# Patient Record
Sex: Female | Born: 1935 | Race: Black or African American | Hispanic: No | Smoking: Never smoker
Health system: Southern US, Community
[De-identification: ages and names within clinical notes are randomized; demographics above are authoritative.]

## PROBLEM LIST (undated history)

## (undated) DIAGNOSIS — I059 Rheumatic mitral valve disease, unspecified: Secondary | ICD-10-CM

## (undated) DIAGNOSIS — F32A Depression, unspecified: Secondary | ICD-10-CM

## (undated) DIAGNOSIS — I10 Essential (primary) hypertension: Secondary | ICD-10-CM

## (undated) DIAGNOSIS — R32 Unspecified urinary incontinence: Secondary | ICD-10-CM

## (undated) DIAGNOSIS — N952 Postmenopausal atrophic vaginitis: Secondary | ICD-10-CM

## (undated) DIAGNOSIS — F419 Anxiety disorder, unspecified: Secondary | ICD-10-CM

## (undated) DIAGNOSIS — T1491XA Suicide attempt, initial encounter: Secondary | ICD-10-CM

## (undated) DIAGNOSIS — R351 Nocturia: Secondary | ICD-10-CM

## (undated) DIAGNOSIS — M1990 Unspecified osteoarthritis, unspecified site: Secondary | ICD-10-CM

## (undated) DIAGNOSIS — J45909 Unspecified asthma, uncomplicated: Secondary | ICD-10-CM

## (undated) DIAGNOSIS — D649 Anemia, unspecified: Secondary | ICD-10-CM

## (undated) DIAGNOSIS — F329 Major depressive disorder, single episode, unspecified: Secondary | ICD-10-CM

## (undated) DIAGNOSIS — K219 Gastro-esophageal reflux disease without esophagitis: Secondary | ICD-10-CM

## (undated) HISTORY — DX: Anxiety disorder, unspecified: F41.9

## (undated) HISTORY — DX: Essential (primary) hypertension: I10

## (undated) HISTORY — DX: Rheumatic mitral valve disease, unspecified: I05.9

## (undated) HISTORY — DX: Anemia, unspecified: D64.9

## (undated) HISTORY — DX: Unspecified asthma, uncomplicated: J45.909

## (undated) HISTORY — DX: Nocturia: R35.1

## (undated) HISTORY — DX: Postmenopausal atrophic vaginitis: N95.2

## (undated) HISTORY — DX: Gastro-esophageal reflux disease without esophagitis: K21.9

## (undated) HISTORY — DX: Unspecified urinary incontinence: R32

## (undated) HISTORY — DX: Unspecified osteoarthritis, unspecified site: M19.90

## (undated) HISTORY — DX: Depression, unspecified: F32.A

## (undated) HISTORY — DX: Suicide attempt, initial encounter: T14.91XA

## (undated) HISTORY — DX: Major depressive disorder, single episode, unspecified: F32.9

---

## 1982-07-24 HISTORY — PX: ABDOMINAL HYSTERECTOMY: SUR658

## 2004-07-24 HISTORY — PX: REPLACEMENT TOTAL KNEE: SUR1224

## 2010-12-27 ENCOUNTER — Ambulatory Visit: Payer: Self-pay | Admitting: Internal Medicine

## 2010-12-29 ENCOUNTER — Ambulatory Visit: Payer: Self-pay | Admitting: Internal Medicine

## 2013-12-16 DIAGNOSIS — M1990 Unspecified osteoarthritis, unspecified site: Secondary | ICD-10-CM | POA: Insufficient documentation

## 2013-12-16 DIAGNOSIS — Z8739 Personal history of other diseases of the musculoskeletal system and connective tissue: Secondary | ICD-10-CM | POA: Insufficient documentation

## 2014-01-17 DIAGNOSIS — E7800 Pure hypercholesterolemia, unspecified: Secondary | ICD-10-CM | POA: Insufficient documentation

## 2014-01-17 DIAGNOSIS — I10 Essential (primary) hypertension: Secondary | ICD-10-CM | POA: Insufficient documentation

## 2014-01-17 DIAGNOSIS — K219 Gastro-esophageal reflux disease without esophagitis: Secondary | ICD-10-CM | POA: Insufficient documentation

## 2014-01-17 DIAGNOSIS — I2510 Atherosclerotic heart disease of native coronary artery without angina pectoris: Secondary | ICD-10-CM | POA: Insufficient documentation

## 2014-01-17 DIAGNOSIS — J310 Chronic rhinitis: Secondary | ICD-10-CM | POA: Insufficient documentation

## 2014-01-17 DIAGNOSIS — Z9114 Patient's other noncompliance with medication regimen: Secondary | ICD-10-CM | POA: Insufficient documentation

## 2014-01-17 DIAGNOSIS — D649 Anemia, unspecified: Secondary | ICD-10-CM | POA: Insufficient documentation

## 2014-01-29 ENCOUNTER — Emergency Department: Payer: Self-pay | Admitting: Emergency Medicine

## 2014-04-15 ENCOUNTER — Ambulatory Visit: Payer: Self-pay | Admitting: Ophthalmology

## 2014-12-23 DIAGNOSIS — Z9189 Other specified personal risk factors, not elsewhere classified: Secondary | ICD-10-CM

## 2014-12-23 DIAGNOSIS — Z2839 Other underimmunization status: Secondary | ICD-10-CM | POA: Insufficient documentation

## 2015-01-28 DIAGNOSIS — Z79899 Other long term (current) drug therapy: Secondary | ICD-10-CM | POA: Insufficient documentation

## 2015-02-24 ENCOUNTER — Encounter: Payer: Self-pay | Admitting: *Deleted

## 2015-02-25 ENCOUNTER — Telehealth: Payer: Self-pay | Admitting: Urology

## 2015-02-25 ENCOUNTER — Encounter: Payer: Self-pay | Admitting: Urology

## 2015-02-25 ENCOUNTER — Ambulatory Visit (INDEPENDENT_AMBULATORY_CARE_PROVIDER_SITE_OTHER): Payer: Medicare Other | Admitting: Urology

## 2015-02-25 VITALS — BP 187/75 | HR 73 | Ht 62.0 in | Wt 217.7 lb

## 2015-02-25 DIAGNOSIS — N952 Postmenopausal atrophic vaginitis: Secondary | ICD-10-CM | POA: Diagnosis not present

## 2015-02-25 DIAGNOSIS — R32 Unspecified urinary incontinence: Secondary | ICD-10-CM

## 2015-02-25 LAB — BLADDER SCAN AMB NON-IMAGING: SCAN RESULT: 12

## 2015-02-25 MED ORDER — OXYBUTYNIN CHLORIDE ER 15 MG PO TB24: 15.0000 mg | ORAL_TABLET | Freq: Every day | ORAL | Status: DC

## 2015-02-25 NOTE — Telephone Encounter (Signed)
Would you call in the compounded estrogen cream to [facility]? 

## 2015-02-25 NOTE — Telephone Encounter (Signed)
Medication called into pharmacy. Pharmacy will contact pt when medication is ready.  

## 2015-02-25 NOTE — Progress Notes (Signed)
02/25/2015 5:02 PM   Annette Combs 08-14-1928 [id number]  Referring provider: No referring provider defined for this encounter.  Chief Complaint  Patient presents with  . Urinary Incontinence    One year recheck    HPI: Annette Combs is an 81 year old African American female with urinary incontinence and vaginal atrophy who presents today for her yearly follow up.  Incontinence: She is still experiencing urinary leakage in spite of the use of oxybutynin ER 15 mg daily. She states that she has to lay towels down on the bed and changes her depends 3 times during the night.  She states that she wears depends during the day as well and may change her depends twice daily. She states she experiences less leakage during the day because she can get to the bathroom on time.  Her leakage volume is quite large at times. At night, she frequently wakes up in the urine has seeped through the depends and onto the towels that she is placed in her bed.    Her PVR is 12 mL. So this is mostly an urge incontinence. She may also has some sleep apnea that may be aggravating the night time incontinence.  She denies fevers, chills, nausea, vomiting or gross hematuria. She also denies any suprapubic pain or abdominal pain.  Atrophic Vaginitis: She is currently using the vaginal estrogen cream 3 times nightly. She is not had any vaginal discomfort or urinary tract infections. Her only complaint concerning the cream is the cost.  PMH: Past Medical History  Diagnosis Date  . Acid reflux   . Anxiety   . Asthma   . Depression   . Hypertension   . Anemia   . Arthritis   . Depression   . Suicide attempt   . Hypertension   . Mitral valve disorder   . Atrophic vaginitis   . Nocturia   . Incontinence     Surgical History: Past Surgical History  Procedure Laterality Date  . Replacement total knee  2006  . Abdominal hysterectomy  1984    Home Medications:    Medication List       This  list is accurate as of: 02/25/15  5:02 PM.  Always use your most recent med list.               acetaminophen 500 MG tablet  Commonly known as:  TYLENOL  Take 500 mg by mouth every 6 (six) hours as needed.     amLODipine 10 MG tablet  Commonly known as:  NORVASC  Take 10 mg by mouth daily.     aspirin 81 MG tablet  Take 81 mg by mouth.     atorvastatin 40 MG tablet  Commonly known as:  LIPITOR  Take 40 mg by mouth daily.     estradiol 0.1 MG/GM vaginal cream  Commonly known as:  ESTRACE  Place 1 Applicatorful vaginally at bedtime.     FLUoxetine 20 MG tablet  Commonly known as:  PROZAC  Take 20 mg by mouth daily.     ibuprofen 200 MG tablet  Commonly known as:  ADVIL,MOTRIN  Take 200 mg by mouth.     losartan 100 MG tablet  Commonly known as:  COZAAR  Take 100 mg by mouth daily.     losartan-hydrochlorothiazide 100-25 MG per tablet  Commonly known as:  HYZAAR  Take by mouth.     metoprolol 50 MG tablet  Commonly known as:  LOPRESSOR  Take  50 mg by mouth.     metoprolol succinate 50 MG 24 hr tablet  Commonly known as:  TOPROL-XL  Take 50 mg by mouth daily. Take with or immediately following a meal.     MIRALAX packet  Generic drug:  polyethylene glycol  Take by mouth.     niMODipine 30 MG capsule  Commonly known as:  NIMOTOP  Take 30 mg by mouth every 4 (four) hours.     omeprazole 20 MG capsule  Commonly known as:  PRILOSEC  Take 20 mg by mouth daily.     oxybutynin 15 MG 24 hr tablet  Commonly known as:  DITROPAN XL  Take 1 tablet (15 mg total) by mouth at bedtime.     oxyCODONE-acetaminophen 5-325 MG per tablet  Commonly known as:  PERCOCET/ROXICET  Take 1 tablet by mouth every 4 (four) hours as needed for severe pain.     potassium chloride 10 MEQ tablet  Commonly known as:  K-DUR,KLOR-CON  Take 10 mEq by mouth 2 (two) times daily.     predniSONE 5 MG tablet  Commonly known as:  DELTASONE  Take 5 mg by mouth.     STOOL SOFTENER 100 MG  capsule  Generic drug:  docusate sodium  Take 100 mg by mouth.     valsartan-hydrochlorothiazide 320-25 MG per tablet  Commonly known as:  DIOVAN-HCT  Take 1 tablet by mouth daily.     ZOFRAN ODT 4 MG disintegrating tablet  Generic drug:  ondansetron  Take 4 mg by mouth.        Allergies: No Known Allergies  Family History: Family History  Problem Relation Age of Onset  . Kidney disease Neg Hx   . Bladder Cancer Neg Hx     Social History:  reports that she has never smoked. She does not have any smokeless tobacco history on file. She reports that she does not drink alcohol or use illicit drugs.  ROS: UROLOGY Frequent Urination?: No Hard to postpone urination?: No Burning/pain with urination?: No Get up at night to urinate?: No Leakage of urine?: Yes Urine stream starts and stops?: No Trouble starting stream?: No Do you have to strain to urinate?: No Blood in urine?: No Urinary tract infection?: No Sexually transmitted disease?: No Injury to kidneys or bladder?: No Painful intercourse?: No Weak stream?: No Currently pregnant?: No Vaginal bleeding?: No Last menstrual period?: n  Gastrointestinal Nausea?: No Vomiting?: No Indigestion/heartburn?: No Diarrhea?: No Constipation?: No  Constitutional Fever: No Night sweats?: No Weight loss?: No Fatigue?: No  Skin Skin rash/lesions?: No Itching?: No  Eyes Blurred vision?: No Double vision?: No  Ears/Nose/Throat Sore throat?: No Sinus problems?: No  Hematologic/Lymphatic Swollen glands?: No Easy bruising?: No  Cardiovascular Leg swelling?: No Chest pain?: No  Respiratory Cough?: No Shortness of breath?: No  Endocrine Excessive thirst?: No  Musculoskeletal Back pain?: No Joint pain?: No  Neurological Headaches?: No Dizziness?: No  Psychologic Depression?: Yes Anxiety?: No  Physical Exam: BP 187/75 mmHg  Pulse 73  Ht 5\' 2"  (1.575 m)  Wt 217 lb 11.2 oz (98.748 kg)  BMI 39.81  kg/m2  GU: Atrophic  external genitalia.  Normal urethral meatus. No urethral masses and/or tenderness. No bladder fullness or masses. No vaginal lesions or discharge. Normal rectal tone, no masses. Normal anus and perineum.   Laboratory Data: No results found for: WBC, HGB, HCT, MCV, PLT  No results found for: CREATININE  No results found for: PSA  No results found for:  TESTOSTERONE  No results found for: HGBA1C  Urinalysis No results found for: COLORURINE, APPEARANCEUR, LABSPEC, PHURINE, GLUCOSEU, HGBUR, BILIRUBINUR, KETONESUR, PROTEINUR, UROBILINOGEN, NITRITE, LEUKOCYTESUR  Pertinent Imaging: Results for orders placed or performed in visit on 02/25/15  BLADDER SCAN AMB NON-IMAGING  Result Value Ref Range   Scan Result 12     Assessment & Plan:    1. Incontinence:   Patient with most likely urge incontinence. I will have her take the oxybutynin in the evening before bed to see if this will decrease the amount of leakage she suffers at night. I have encouraged her to speak with her family care provider about sleep apnea and see if she is a candidate for a sleep study. I explained to her that individuals with sleep apnea may experience nocturia and once the sleep apnea is addressed the nocturia improves.  - BLADDER SCAN AMB NON-IMAGING  2. Atrophic vaginitis:   Patient will continue vaginal estrogen cream. We will call in the compounded vaginal estrogen cream to medical to see if this is a more economical solution for her. I did give her a sample of estrogen cream today. She will continue to apply Monday nights, Wednesday nights and Friday nights with her finger tip.  - BLADDER SCAN AMB NON-IMAGING   Return in about 1 year (around 02/25/2016) for PVR and exam.  Michiel Cowboy, [facility] [address], [address] [place], [place] [address] [phone]

## 2015-06-09 ENCOUNTER — Telehealth: Payer: Self-pay | Admitting: Urology

## 2015-06-09 DIAGNOSIS — N952 Postmenopausal atrophic vaginitis: Secondary | ICD-10-CM

## 2015-06-09 MED ORDER — ESTRADIOL 0.1 MG/GM VA CREA: 1.0000 | TOPICAL_CREAM | Freq: Every day | VAGINAL | Status: DC

## 2015-06-09 NOTE — Telephone Encounter (Signed)
Rachel from [facility] in [place] called and has a question about Annette Combs's 0.01% Estrace Cream prescribed by Annette Combs.   Please give her a call at [phone]

## 2015-06-11 ENCOUNTER — Other Ambulatory Visit: Payer: Self-pay

## 2015-06-11 DIAGNOSIS — N952 Postmenopausal atrophic vaginitis: Secondary | ICD-10-CM

## 2015-06-11 MED ORDER — ESTRADIOL 0.1 MG/GM VA CREA: 1.0000 | TOPICAL_CREAM | Freq: Every day | VAGINAL | Status: AC

## 2015-08-19 DIAGNOSIS — F331 Major depressive disorder, recurrent, moderate: Secondary | ICD-10-CM | POA: Insufficient documentation

## 2015-08-21 DIAGNOSIS — E538 Deficiency of other specified B group vitamins: Secondary | ICD-10-CM | POA: Insufficient documentation

## 2016-02-25 ENCOUNTER — Ambulatory Visit: Payer: Medicare Other | Admitting: Urology

## 2016-02-27 ENCOUNTER — Other Ambulatory Visit: Payer: Self-pay | Admitting: Urology

## 2016-02-27 DIAGNOSIS — F419 Anxiety disorder, unspecified: Secondary | ICD-10-CM | POA: Insufficient documentation

## 2016-02-28 NOTE — Telephone Encounter (Signed)
Patient will need an office visit prior to more refills.

## 2016-03-11 ENCOUNTER — Other Ambulatory Visit: Payer: Self-pay | Admitting: Urology

## 2016-03-29 ENCOUNTER — Encounter: Payer: Self-pay | Admitting: Urology

## 2016-03-29 ENCOUNTER — Ambulatory Visit (INDEPENDENT_AMBULATORY_CARE_PROVIDER_SITE_OTHER): Payer: Medicare Other | Admitting: Urology

## 2016-03-29 VITALS — BP 167/65 | HR 60 | Ht 62.0 in | Wt 213.5 lb

## 2016-03-29 DIAGNOSIS — N952 Postmenopausal atrophic vaginitis: Secondary | ICD-10-CM

## 2016-03-29 DIAGNOSIS — R32 Unspecified urinary incontinence: Secondary | ICD-10-CM | POA: Diagnosis not present

## 2016-03-29 LAB — BLADDER SCAN AMB NON-IMAGING: SCAN RESULT: 58

## 2016-03-29 MED ORDER — OXYBUTYNIN CHLORIDE ER 15 MG PO TB24: 15.0000 mg | ORAL_TABLET | Freq: Every day | ORAL | 12 refills | Status: DC

## 2016-03-29 NOTE — Progress Notes (Signed)
03/29/2016 11:02 AM   Annette Combs 1929-03-12 [id number]  Referring provider: Mickey Farber, MD [address] [facility] [place], [place] [address]  Chief Complaint  Patient presents with  . Follow-up    Incontinence    HPI: Annette Combs is an 82 year old African American female with urinary incontinence and vaginal atrophy who presents today for her yearly follow up.  Incontinence: Patient states that she found benefit switching the oxybutynin ER 15 mg daily to a nighttime dosage.   She states that she is doing well, but her daughter that presented with her today looked as though she disagreed with her mother.  When I asked the daughter for further clarification on her facial expressions, she stated that her mother was the patient and she had nothing to add.  I am suspicious that her daughter may be experiencing frustration with her mother as she is her primary caregiver.  Her PVR is 58  mL.  She denies fevers, chills, nausea, vomiting or gross hematuria.  She also denies any suprapubic pain or abdominal pain.  Atrophic Vaginitis: She is currently using Vaseline to cover her perineum due to the expense of the vaginal estrogen cream.   Her daughter states that she had a urinary tract infection two months ago.  Her daughter states that she was having an increase of mental confusion.    PMH: Past Medical History:  Diagnosis Date  . Acid reflux   . Anemia   . Anxiety   . Arthritis   . Asthma   . Atrophic vaginitis   . Depression   . Depression   . Hypertension   . Hypertension   . Incontinence   . Mitral valve disorder   . Nocturia   . Suicide attempt [facility])     Surgical History: Past Surgical History:  Procedure Laterality Date  . ABDOMINAL HYSTERECTOMY  1984  . REPLACEMENT TOTAL KNEE  2006    Home Medications:    Medication List       Accurate as of 03/29/16 11:02 AM. Always use your most recent med list.          acetaminophen 500 MG  tablet Commonly known as:  TYLENOL Take 500 mg by mouth every 6 (six) hours as needed.   amLODipine 10 MG tablet Commonly known as:  NORVASC Take 10 mg by mouth daily.   aspirin 81 MG tablet Take 81 mg by mouth.   atorvastatin 40 MG tablet Commonly known as:  LIPITOR Take 40 mg by mouth daily.   estradiol 0.1 MG/GM vaginal cream Commonly known as:  ESTRACE Place 1 Applicatorful vaginally at bedtime.   FLUoxetine 20 MG tablet Commonly known as:  PROZAC Take 20 mg by mouth daily.   ibuprofen 200 MG tablet Commonly known as:  ADVIL,MOTRIN Take 200 mg by mouth.   losartan 100 MG tablet Commonly known as:  COZAAR Take 100 mg by mouth daily.   losartan-hydrochlorothiazide 100-25 MG tablet Commonly known as:  HYZAAR Take by mouth.   metoprolol 50 MG tablet Commonly known as:  LOPRESSOR Take 50 mg by mouth.   metoprolol succinate 50 MG 24 hr tablet Commonly known as:  TOPROL-XL Take 50 mg by mouth daily. Take with or immediately following a meal.   MIRALAX packet Generic drug:  polyethylene glycol Take by mouth.   niMODipine 30 MG capsule Commonly known as:  NIMOTOP Take 30 mg by mouth every 4 (four) hours.   omeprazole 20 MG capsule  Commonly known as:  PRILOSEC Take 20 mg by mouth daily.   oxybutynin 15 MG 24 hr tablet Commonly known as:  DITROPAN XL TAKE 1 TABLET (15 MG TOTAL) BY MOUTH AT BEDTIME.   oxybutynin 15 MG 24 hr tablet Commonly known as:  DITROPAN XL Take 1 tablet (15 mg total) by mouth at bedtime.   oxyCODONE-acetaminophen 5-325 MG tablet Commonly known as:  PERCOCET/ROXICET Take 1 tablet by mouth every 4 (four) hours as needed for severe pain.   potassium chloride 10 MEQ tablet Commonly known as:  K-DUR,KLOR-CON Take 10 mEq by mouth 2 (two) times daily.   predniSONE 5 MG tablet Commonly known as:  DELTASONE Take 5 mg by mouth.   STOOL SOFTENER 100 MG capsule Generic drug:  docusate sodium Take 100 mg by mouth.    valsartan-hydrochlorothiazide 320-25 MG tablet Commonly known as:  DIOVAN-HCT Take 1 tablet by mouth daily.   ZOFRAN ODT 4 MG disintegrating tablet Generic drug:  ondansetron Take 4 mg by mouth.       Allergies: No Known Allergies  Family History: Family History  Problem Relation Age of Onset  . Kidney disease Neg Hx   . Bladder Cancer Neg Hx     Social History:  reports that she has never smoked. She has never used smokeless tobacco. She reports that she does not drink alcohol or use drugs.  ROS: UROLOGY Frequent Urination?: No Hard to postpone urination?: No Burning/pain with urination?: No Get up at night to urinate?: No Leakage of urine?: No Urine stream starts and stops?: No Trouble starting stream?: No Do you have to strain to urinate?: No Blood in urine?: No Urinary tract infection?: No Sexually transmitted disease?: No Injury to kidneys or bladder?: No Painful intercourse?: No Weak stream?: No Currently pregnant?: No Vaginal bleeding?: No Last menstrual period?: n  Gastrointestinal Nausea?: No Vomiting?: No Indigestion/heartburn?: No Diarrhea?: No Constipation?: No  Constitutional Fever: No Night sweats?: No Weight loss?: No Fatigue?: No  Skin Skin rash/lesions?: No Itching?: No  Eyes Blurred vision?: No Double vision?: No  Ears/Nose/Throat Sore throat?: No Sinus problems?: No  Hematologic/Lymphatic Swollen glands?: No Easy bruising?: No  Cardiovascular Leg swelling?: No Chest pain?: No  Respiratory Cough?: No Shortness of breath?: No  Endocrine Excessive thirst?: No  Musculoskeletal Back pain?: No Joint pain?: No  Neurological Headaches?: No Dizziness?: No  Psychologic Depression?: No Anxiety?: No  Physical Exam: BP (!) 167/65 (BP Location: Right Arm, Patient Position: Sitting, Cuff Size: Normal)   Pulse 60   Ht 5\' 2"  (1.575 m)   Wt 213 lb 8 oz (96.8 kg)   BMI 39.05 kg/m   Constitutional: Well nourished.  Alert and oriented, No acute distress. HEENT: [place] AT, moist mucus membranes. Trachea midline, no masses. Cardiovascular: No clubbing, cyanosis, or edema. Respiratory: Normal respiratory effort, no increased work of breathing. Skin: No rashes, bruises or suspicious lesions. Lymph: No cervical or inguinal adenopathy. Neurologic: Grossly intact, no focal deficits, moving all 4 extremities. Psychiatric: Normal mood and affect.   Pertinent Imaging: Results for orders placed or performed in visit on 03/29/16  Bladder Scan (Post Void Residual) in office  Result Value Ref Range   Scan Result 58     Assessment & Plan:    1. Incontinence:     - Patient is satisfied with the oxybutynin and will continue that medication  - Refills not needed at this time  - BLADDER SCAN AMB NON-IMAGING  2. Atrophic vaginitis:     -  gave the patient a sample of Premarin cream  - advised to apply the cream three nights weekly  - encouraged the patient to call the office to see if samples are available  - reminded the patient that the estrogen cream will prevent UTI's  Return in about 1 year (around 03/29/2017) for PVR and exam.  Michiel CowboySHANNON Toshia Larkin, [facility]  [facility] [address], [address] [place], [place] [address] [phone]

## 2016-03-29 NOTE — Patient Instructions (Signed)

## 2017-03-28 NOTE — Progress Notes (Signed)
03/29/2017 11:08 AM   Annette Combs [birth year] [id number]  Referring provider: Mickey Farber, MD [address] [facility] [place], [place] [address]  Chief Complaint  Patient presents with  . Urinary Incontinence    1 year follow up   . Vaginitis    HPI: Annette Combs is an 83 year old African American female with urinary incontinence and vaginal atrophy who presents today for her yearly follow up.  Incontinence: Patient is no longer on OAB medications.  The patient is on fall precautions and cannot get up without assistance.  She is currently at [facility] [employer].  She has incontinence 8 or more times daily.  Her PVR is 216.8 mL.  She denies fevers, chills, nausea, vomiting or gross hematuria.  She also denies any suprapubic pain or abdominal pain.  Atrophic Vaginitis: She is currently using Vaseline to cover her perineum due to the expense of the vaginal estrogen cream.   Her daughter states that she had a urinary tract infection in 12/2016.  Her daughter states that she was having an increase of mental confusion.    PMH: Past Medical History:  Diagnosis Date  . Acid reflux   . Anemia   . Anxiety   . Arthritis   . Asthma   . Atrophic vaginitis   . Depression   . Depression   . Hypertension   . Hypertension   . Incontinence   . Mitral valve disorder   . Nocturia   . Suicide attempt [facility])     Surgical History: Past Surgical History:  Procedure Laterality Date  . ABDOMINAL HYSTERECTOMY  1984  . REPLACEMENT TOTAL KNEE  2006    Home Medications:  Allergies as of 03/29/2017   No Known Allergies     Medication List       Accurate as of 03/29/17 11:08 AM. Always use your most recent med list.          acetaminophen 500 MG tablet Commonly known as:  TYLENOL Take 500 mg by mouth every 6 (six) hours as needed.   amLODipine 10 MG tablet Commonly known as:  NORVASC Take 10 mg by mouth daily.   aspirin 81 MG tablet Take 81 mg by mouth.     atorvastatin 40 MG tablet Commonly known as:  LIPITOR Take 40 mg by mouth daily.   cephALEXin 500 MG capsule Commonly known as:  KEFLEX Take 500 mg by mouth.   diclofenac sodium 1 % Gel Commonly known as:  VOLTAREN Apply topically.   estradiol 0.1 MG/GM vaginal cream Commonly known as:  ESTRACE Place 1 Applicatorful vaginally at bedtime.   FLUoxetine 40 MG capsule Commonly known as:  PROZAC Take 40 mg by mouth.   ibuprofen 200 MG tablet Commonly known as:  ADVIL,MOTRIN Take 200 mg by mouth.   losartan 100 MG tablet Commonly known as:  COZAAR Take 100 mg by mouth daily.   losartan-hydrochlorothiazide 100-25 MG tablet Commonly known as:  HYZAAR Take by mouth.   metoprolol succinate 50 MG 24 hr tablet Commonly known as:  TOPROL-XL Take 50 mg by mouth daily. Take with or immediately following a meal.   metoprolol tartrate 50 MG tablet Commonly known as:  LOPRESSOR Take 50 mg by mouth.   MIRALAX packet Generic drug:  polyethylene glycol Take by mouth.   niMODipine 30 MG capsule Commonly known as:  NIMOTOP Take 30 mg by mouth every 4 (four) hours.   omeprazole 20 MG capsule Commonly known as:  PRILOSEC  Take 20 mg by mouth daily.   oxybutynin 15 MG 24 hr tablet Commonly known as:  DITROPAN XL TAKE 1 TABLET (15 MG TOTAL) BY MOUTH AT BEDTIME.   oxybutynin 15 MG 24 hr tablet Commonly known as:  DITROPAN XL Take 1 tablet (15 mg total) by mouth at bedtime.   oxyCODONE-acetaminophen 5-325 MG tablet Commonly known as:  PERCOCET/ROXICET Take 1 tablet by mouth every 4 (four) hours as needed for severe pain.   potassium chloride 10 MEQ tablet Commonly known as:  K-DUR,KLOR-CON Take 10 mEq by mouth 2 (two) times daily.   predniSONE 5 MG tablet Commonly known as:  DELTASONE Take 5 mg by mouth.   rivaroxaban 20 MG Tabs tablet Commonly known as:  XARELTO Take 20 mg by mouth.   SM IRON 325 (65 FE) MG tablet Generic drug:  ferrous sulfate Take 325 mg by  mouth.   STOOL SOFTENER 100 MG capsule Generic drug:  docusate sodium Take 100 mg by mouth.   tamsulosin 0.4 MG Caps capsule Commonly known as:  FLOMAX Take 1 capsule (0.4 mg total) by mouth daily.   valsartan-hydrochlorothiazide 320-25 MG tablet Commonly known as:  DIOVAN-HCT Take 1 tablet by mouth daily.   vitamin B-12 1000 MCG tablet Commonly known as:  CYANOCOBALAMIN Take by mouth.   ZOFRAN ODT 4 MG disintegrating tablet Generic drug:  ondansetron Take 4 mg by mouth.            Discharge Care Instructions        Start     Ordered   03/29/17 0000  BLADDER SCAN AMB NON-IMAGING     03/29/17 1000   03/29/17 0000  tamsulosin (FLOMAX) 0.4 MG CAPS capsule  Daily    Question:  Supervising Provider  Answer:  Vanna Scotland   03/29/17 1107      Allergies: No Known Allergies  Family History: Family History  Problem Relation Age of Onset  . Kidney disease Neg Hx   . Bladder Cancer Neg Hx   . Kidney cancer Neg Hx     Social History:  reports that she has never smoked. She has never used smokeless tobacco. She reports that she does not drink alcohol or use drugs.  ROS: UROLOGY Frequent Urination?: No Hard to postpone urination?: No Burning/pain with urination?: No Get up at night to urinate?: No Leakage of urine?: Yes Urine stream starts and stops?: No Trouble starting stream?: No Do you have to strain to urinate?: No Blood in urine?: No Urinary tract infection?: No Sexually transmitted disease?: No Injury to kidneys or bladder?: No Painful intercourse?: No Weak stream?: No Currently pregnant?: No Vaginal bleeding?: No Last menstrual period?: n  Gastrointestinal Nausea?: No Vomiting?: No Indigestion/heartburn?: No Diarrhea?: No Constipation?: No  Constitutional Fever: No Night sweats?: No Weight loss?: No Fatigue?: No  Skin Skin rash/lesions?: No Itching?: No  Eyes Blurred vision?: No Double vision?: No  Ears/Nose/Throat Sore  throat?: No Sinus problems?: No  Hematologic/Lymphatic Swollen glands?: No Easy bruising?: No  Cardiovascular Leg swelling?: No Chest pain?: No  Respiratory Cough?: No Shortness of breath?: No  Endocrine Excessive thirst?: No  Musculoskeletal Back pain?: No Joint pain?: No  Neurological Headaches?: No Dizziness?: No  Psychologic Depression?: No Anxiety?: No  Physical Exam: BP (!) 144/77   Pulse 71   Ht 5\' 2"  (1.575 m)   Wt 216 lb 12.8 oz (98.3 kg)   BMI 39.65 kg/m   Constitutional: Well nourished. Alert and oriented, No acute distress. HEENT: [place]  AT, moist mucus membranes. Trachea midline, no masses. Cardiovascular: No clubbing, cyanosis, or edema. Respiratory: Normal respiratory effort, no increased work of breathing. GI: Abdomen is soft, non tender, non distended, no abdominal masses. Liver and spleen not palpable.  No hernias appreciated.  Stool sample for occult testing is not indicated.   GU: No CVA tenderness.  No bladder fullness or masses.   Skin: No rashes, bruises or suspicious lesions. Lymph: No cervical or inguinal adenopathy. Neurologic: Grossly intact, no focal deficits, moving all 4 extremities. Psychiatric: Normal mood and affect.   Pertinent Imaging: Results for Annette CzechBROWNING, Annette G (MRN [id number]) as of 03/29/2017 11:14  Ref. Range 03/29/2017 11:13  Scan Result Unknown 216.8    Assessment & Plan:    1. Incontinence:     - BLADDER SCAN AMB NON-IMAGING  - incomplete bladder emptying   - overflow incontinence - start a trial of Flomax   2. Atrophic vaginitis:     - patient finds the vaginal estrogen cream cost prohibitive  3. Nocturia   - I explained to the patient that nocturia is often multi-factorial and difficult to treat.  Sleeping disorders, heart conditions, peripheral vascular disease, diabetes, an enlarged prostate for men, an urethral stricture causing bladder outlet obstruction and/or certain medications can contribute to  nocturia.  - I have suggested that the patient avoid caffeine after noon and alcohol in the evening.  He or she may also benefit from fluid restrictions after 6:00 in the evening and voiding just prior to bedtime.  - I have explained that research studies have showed that over 84% of patients with sleep apnea reported frequent nighttime urination.   With sleep apnea, oxygen decreases, carbon dioxide increases, the blood become more acidic, the heart rate drops and blood vessels in the lung constrict.  The body is then alerted that something is very wrong. The sleeper must wake enough to reopen the airway. By this time, the heart is racing and experiences a false signal of fluid overload. The heart excretes a hormone-like protein that tells the body to get rid of sodium and water, resulting in nocturia.  -  I also informed the patient that a recent study noted that decreasing sodium intake to 2.3 grams daily, if they don't have issues with hyponatremia, can also reduce the number of nightly voids  - There is also an increased incidence in sleep apnea with menopause, symptoms include night sweats, daytime sleepiness, depressed mood, and cognitive complaints like poor concentration or problems with short-term memory   - The patient may benefit from a discussion with his or her primary care physician to see if he or she has risk factors for sleep apnea or other sleep disturbances and obtaining a sleep study.  4. Incomplete bladder emptying  - start tamsulosin 0.4 mg daily  - RTC in one month for OAB questionnaire and PVR    Return in about 1 month (around 04/28/2017) for PVR and OAB questionnaire.  Michiel CowboySHANNON Brayam Boeke, PA-C  [facility] [address], [address] [place], [place] [address] [phone]

## 2017-03-29 ENCOUNTER — Ambulatory Visit (INDEPENDENT_AMBULATORY_CARE_PROVIDER_SITE_OTHER): Payer: Medicare Other | Admitting: Urology

## 2017-03-29 ENCOUNTER — Encounter: Payer: Self-pay | Admitting: Urology

## 2017-03-29 VITALS — BP 144/77 | HR 71 | Ht 62.0 in | Wt 216.8 lb

## 2017-03-29 DIAGNOSIS — N3946 Mixed incontinence: Secondary | ICD-10-CM

## 2017-03-29 DIAGNOSIS — N952 Postmenopausal atrophic vaginitis: Secondary | ICD-10-CM

## 2017-03-29 DIAGNOSIS — R339 Retention of urine, unspecified: Secondary | ICD-10-CM

## 2017-03-29 DIAGNOSIS — R351 Nocturia: Secondary | ICD-10-CM | POA: Diagnosis not present

## 2017-03-29 LAB — BLADDER SCAN AMB NON-IMAGING: SCAN RESULT: 216.8

## 2017-03-29 MED ORDER — TAMSULOSIN HCL 0.4 MG PO CAPS: 0.4000 mg | ORAL_CAPSULE | Freq: Every day | ORAL | 3 refills | Status: DC

## 2017-04-23 ENCOUNTER — Telehealth: Payer: Self-pay | Admitting: Urology

## 2017-04-23 NOTE — Progress Notes (Deleted)
04/25/2017 9:30 PM   Annette Combs October 25, 1928 [id number]  Referring provider: Mickey Farber, MD [address] [facility] [place], [place] [address]  No chief complaint on file.   HPI: Annette Combs is an 83 year old African American female with urinary incontinence and vaginal atrophy who presents today for a one month follow up after a trial of tamsulosin 0.4 mg for incomplete bladder emptying.    Incontinence: Patient is no longer on OAB medications.  The patient is on fall precautions and cannot get up without assistance.  She is currently at [facility] [employer].  She has incontinence 8 or more times daily.  Her PVR is 216.8 mL.  She denies fevers, chills, nausea, vomiting or gross hematuria.  She also denies any suprapubic pain or abdominal pain.  Atrophic Vaginitis: She is currently using Vaseline to cover her perineum due to the expense of the vaginal estrogen cream.   Her daughter states that she had a urinary tract infection in 12/2016.  Her daughter states that she was having an increase of mental confusion.    PMH: Past Medical History:  Diagnosis Date  . Acid reflux   . Anemia   . Anxiety   . Arthritis   . Asthma   . Atrophic vaginitis   . Depression   . Depression   . Hypertension   . Hypertension   . Incontinence   . Mitral valve disorder   . Nocturia   . Suicide attempt [facility])     Surgical History: Past Surgical History:  Procedure Laterality Date  . ABDOMINAL HYSTERECTOMY  1984  . REPLACEMENT TOTAL KNEE  2006    Home Medications:  Allergies as of 04/25/2017   No Known Allergies     Medication List       Accurate as of 04/23/17  9:30 PM. Always use your most recent med list.          acetaminophen 500 MG tablet Commonly known as:  TYLENOL Take 500 mg by mouth every 6 (six) hours as needed.   amLODipine 10 MG tablet Commonly known as:  NORVASC Take 10 mg by mouth daily.   aspirin 81 MG tablet Take 81 mg by mouth.     atorvastatin 40 MG tablet Commonly known as:  LIPITOR Take 40 mg by mouth daily.   diclofenac sodium 1 % Gel Commonly known as:  VOLTAREN Apply topically.   estradiol 0.1 MG/GM vaginal cream Commonly known as:  ESTRACE Place 1 Applicatorful vaginally at bedtime.   FLUoxetine 40 MG capsule Commonly known as:  PROZAC Take 40 mg by mouth.   ibuprofen 200 MG tablet Commonly known as:  ADVIL,MOTRIN Take 200 mg by mouth.   losartan 100 MG tablet Commonly known as:  COZAAR Take 100 mg by mouth daily.   losartan-hydrochlorothiazide 100-25 MG tablet Commonly known as:  HYZAAR Take by mouth.   metoprolol succinate 50 MG 24 hr tablet Commonly known as:  TOPROL-XL Take 50 mg by mouth daily. Take with or immediately following a meal.   metoprolol tartrate 50 MG tablet Commonly known as:  LOPRESSOR Take 50 mg by mouth.   MIRALAX packet Generic drug:  polyethylene glycol Take by mouth.   niMODipine 30 MG capsule Commonly known as:  NIMOTOP Take 30 mg by mouth every 4 (four) hours.   omeprazole 20 MG capsule Commonly known as:  PRILOSEC Take 20 mg by mouth daily.   oxybutynin 15 MG 24 hr tablet Commonly known as:  DITROPAN XL TAKE 1 TABLET (15 MG TOTAL) BY MOUTH AT BEDTIME.   oxybutynin 15 MG 24 hr tablet Commonly known as:  DITROPAN XL Take 1 tablet (15 mg total) by mouth at bedtime.   oxyCODONE-acetaminophen 5-325 MG tablet Commonly known as:  PERCOCET/ROXICET Take 1 tablet by mouth every 4 (four) hours as needed for severe pain.   potassium chloride 10 MEQ tablet Commonly known as:  K-DUR,KLOR-CON Take 10 mEq by mouth 2 (two) times daily.   predniSONE 5 MG tablet Commonly known as:  DELTASONE Take 5 mg by mouth.   rivaroxaban 20 MG Tabs tablet Commonly known as:  XARELTO Take 20 mg by mouth.   SM IRON 325 (65 FE) MG tablet Generic drug:  ferrous sulfate Take 325 mg by mouth.   STOOL SOFTENER 100 MG capsule Generic drug:  docusate sodium Take 100 mg  by mouth.   tamsulosin 0.4 MG Caps capsule Commonly known as:  FLOMAX Take 1 capsule (0.4 mg total) by mouth daily.   valsartan-hydrochlorothiazide 320-25 MG tablet Commonly known as:  DIOVAN-HCT Take 1 tablet by mouth daily.   vitamin B-12 1000 MCG tablet Commonly known as:  CYANOCOBALAMIN Take by mouth.   ZOFRAN ODT 4 MG disintegrating tablet Generic drug:  ondansetron Take 4 mg by mouth.       Allergies: No Known Allergies  Family History: Family History  Problem Relation Age of Onset  . Kidney disease Neg Hx   . Bladder Cancer Neg Hx   . Kidney cancer Neg Hx     Social History:  reports that she has never smoked. She has never used smokeless tobacco. She reports that she does not drink alcohol or use drugs.  ROS:                                        Physical Exam: There were no vitals taken for this visit.  Constitutional: Well nourished. Alert and oriented, No acute distress. HEENT: [place] AT, moist mucus membranes. Trachea midline, no masses. Cardiovascular: No clubbing, cyanosis, or edema. Respiratory: Normal respiratory effort, no increased work of breathing. GI: Abdomen is soft, non tender, non distended, no abdominal masses. Liver and spleen not palpable.  No hernias appreciated.  Stool sample for occult testing is not indicated.   GU: No CVA tenderness.  No bladder fullness or masses.   Skin: No rashes, bruises or suspicious lesions. Lymph: No cervical or inguinal adenopathy. Neurologic: Grossly intact, no focal deficits, moving all 4 extremities. Psychiatric: Normal mood and affect.   Pertinent Imaging: ***   Assessment & Plan:    1. Incontinence:     - BLADDER SCAN AMB NON-IMAGING  - incomplete bladder emptying   - overflow incontinence - start a trial of Flomax   2. Atrophic vaginitis:     - patient finds the vaginal estrogen cream cost prohibitive - sample tube of Premarin given on 04/23/2017  3. Nocturia   - I  explained to the patient that nocturia is often multi-factorial and difficult to treat.  Sleeping disorders, heart conditions, peripheral vascular disease, diabetes, an enlarged prostate for men, an urethral stricture causing bladder outlet obstruction and/or certain medications can contribute to nocturia.  - I have suggested that the patient avoid caffeine after noon and alcohol in the evening.  He or she may also benefit from fluid restrictions after 6:00 in the evening and voiding just  prior to bedtime.  - I have explained that research studies have showed that over 84% of patients with sleep apnea reported frequent nighttime urination.   With sleep apnea, oxygen decreases, carbon dioxide increases, the blood become more acidic, the heart rate drops and blood vessels in the lung constrict.  The body is then alerted that something is very wrong. The sleeper must wake enough to reopen the airway. By this time, the heart is racing and experiences a false signal of fluid overload. The heart excretes a hormone-like protein that tells the body to get rid of sodium and water, resulting in nocturia.  -  I also informed the patient that a recent study noted that decreasing sodium intake to 2.3 grams daily, if they don't have issues with hyponatremia, can also reduce the number of nightly voids  - There is also an increased incidence in sleep apnea with menopause, symptoms include night sweats, daytime sleepiness, depressed mood, and cognitive complaints like poor concentration or problems with short-term memory   - The patient may benefit from a discussion with his or her primary care physician to see if he or she has risk factors for sleep apnea or other sleep disturbances and obtaining a sleep study.  4. Incomplete bladder emptying  - start tamsulosin 0.4 mg daily  - RTC in one month for OAB questionnaire and PVR    No Follow-up on file.  Michiel Cowboy, PA-C  [facility] [address], [address] [place], [place] [address] [phone]

## 2017-04-23 NOTE — Telephone Encounter (Signed)
Daughter calls asking for samples for her mother, Estrace vaginal cream. Daughter will be in the area this morning. Please let daughter know if she can pick up samples this morning, call Pandora at [phone]

## 2017-04-25 ENCOUNTER — Ambulatory Visit: Payer: Medicare Other | Admitting: Urology

## 2017-04-26 NOTE — Telephone Encounter (Signed)
Samples were given  °

## 2018-05-30 ENCOUNTER — Other Ambulatory Visit
Admission: RE | Admit: 2018-05-30 | Discharge: 2018-05-30 | Disposition: A | Discharge status: Home / Self Care | Payer: Medicare Other | Source: Ambulatory Visit | Attending: Family Medicine | Admitting: Family Medicine

## 2018-05-30 DIAGNOSIS — I509 Heart failure, unspecified: Secondary | ICD-10-CM | POA: Diagnosis present

## 2018-05-30 DIAGNOSIS — R0602 Shortness of breath: Secondary | ICD-10-CM | POA: Insufficient documentation

## 2018-05-30 LAB — BRAIN NATRIURETIC PEPTIDE: B Natriuretic Peptide: 41 pg/mL (ref 0.0–100.0)

## 2018-12-18 ENCOUNTER — Emergency Department
Admission: EM | Admit: 2018-12-18 | Discharge: 2018-12-18 | Disposition: A | Discharge status: Home / Self Care | Payer: Medicare Other | Attending: Emergency Medicine | Admitting: Emergency Medicine

## 2018-12-18 ENCOUNTER — Other Ambulatory Visit: Payer: Self-pay

## 2018-12-18 ENCOUNTER — Telehealth: Payer: Self-pay | Admitting: *Deleted

## 2018-12-18 ENCOUNTER — Encounter: Payer: Self-pay | Admitting: Emergency Medicine

## 2018-12-18 ENCOUNTER — Emergency Department: Payer: Medicare Other

## 2018-12-18 DIAGNOSIS — J45909 Unspecified asthma, uncomplicated: Secondary | ICD-10-CM | POA: Insufficient documentation

## 2018-12-18 DIAGNOSIS — U071 COVID-19: Secondary | ICD-10-CM

## 2018-12-18 DIAGNOSIS — J9601 Acute respiratory failure with hypoxia: Secondary | ICD-10-CM | POA: Diagnosis not present

## 2018-12-18 DIAGNOSIS — I10 Essential (primary) hypertension: Secondary | ICD-10-CM | POA: Insufficient documentation

## 2018-12-18 DIAGNOSIS — R0602 Shortness of breath: Secondary | ICD-10-CM | POA: Diagnosis present

## 2018-12-18 DIAGNOSIS — R0902 Hypoxemia: Secondary | ICD-10-CM | POA: Diagnosis not present

## 2018-12-18 DIAGNOSIS — R05 Cough: Secondary | ICD-10-CM | POA: Insufficient documentation

## 2018-12-18 LAB — CBC WITH DIFFERENTIAL/PLATELET
Abs Immature Granulocytes: 0.26 10*3/uL — ABNORMAL HIGH (ref 0.00–0.07)
Basophils Absolute: 0 10*3/uL (ref 0.0–0.1)
Basophils Relative: 0 %
Eosinophils Absolute: 0 10*3/uL (ref 0.0–0.5)
Eosinophils Relative: 0 %
HCT: 38.5 % (ref 36.0–46.0)
Hemoglobin: 12.1 g/dL (ref 12.0–15.0)
Immature Granulocytes: 3 %
Lymphocytes Relative: 9 %
Lymphs Abs: 0.8 10*3/uL (ref 0.7–4.0)
MCH: 31.3 pg (ref 26.0–34.0)
MCHC: 31.4 g/dL (ref 30.0–36.0)
MCV: 99.5 fL (ref 80.0–100.0)
Monocytes Absolute: 0.4 10*3/uL (ref 0.1–1.0)
Monocytes Relative: 5 %
Neutro Abs: 7.2 10*3/uL (ref 1.7–7.7)
Neutrophils Relative %: 83 %
Platelets: 223 10*3/uL (ref 150–400)
RBC: 3.87 MIL/uL (ref 3.87–5.11)
RDW: 15.9 % — ABNORMAL HIGH (ref 11.5–15.5)
WBC: 8.6 10*3/uL (ref 4.0–10.5)
nRBC: 0 % (ref 0.0–0.2)

## 2018-12-18 LAB — LACTATE DEHYDROGENASE: LDH: 406 U/L — ABNORMAL HIGH (ref 98–192)

## 2018-12-18 LAB — BLOOD GAS, VENOUS
Acid-Base Excess: 2.4 mmol/L — ABNORMAL HIGH (ref 0.0–2.0)
Bicarbonate: 28.8 mmol/L — ABNORMAL HIGH (ref 20.0–28.0)
O2 Saturation: 88 %
Patient temperature: 37
pCO2, Ven: 51 mmHg (ref 44.0–60.0)
pH, Ven: 7.36 (ref 7.250–7.430)
pO2, Ven: 57 mmHg — ABNORMAL HIGH (ref 32.0–45.0)

## 2018-12-18 LAB — COMPREHENSIVE METABOLIC PANEL
ALT: 30 U/L (ref 0–44)
AST: 50 U/L — ABNORMAL HIGH (ref 15–41)
Albumin: 3.2 g/dL — ABNORMAL LOW (ref 3.5–5.0)
Alkaline Phosphatase: 50 U/L (ref 38–126)
Anion gap: 12 (ref 5–15)
BUN: 37 mg/dL — ABNORMAL HIGH (ref 8–23)
CO2: 28 mmol/L (ref 22–32)
Calcium: 9 mg/dL (ref 8.9–10.3)
Chloride: 98 mmol/L (ref 98–111)
Creatinine, Ser: 0.98 mg/dL (ref 0.44–1.00)
GFR calc Af Amer: 60 mL/min — ABNORMAL LOW (ref >60)
GFR calc non Af Amer: 52 mL/min — ABNORMAL LOW (ref >60)
Glucose, Bld: 109 mg/dL — ABNORMAL HIGH (ref 70–99)
Potassium: 4 mmol/L (ref 3.5–5.1)
Sodium: 138 mmol/L (ref 135–145)
Total Bilirubin: 1 mg/dL (ref 0.3–1.2)
Total Protein: 7 g/dL (ref 6.5–8.1)

## 2018-12-18 LAB — C-REACTIVE PROTEIN: CRP: 16.5 mg/dL — ABNORMAL HIGH (ref <1.0)

## 2018-12-18 LAB — FERRITIN: Ferritin: 434 ng/mL — ABNORMAL HIGH (ref 11–307)

## 2018-12-18 LAB — LACTIC ACID, PLASMA: Lactic Acid, Venous: 1.3 mmol/L (ref 0.5–1.9)

## 2018-12-18 LAB — TRIGLYCERIDES: Triglycerides: 118 mg/dL (ref <150)

## 2018-12-18 LAB — FIBRINOGEN: Fibrinogen: 548 mg/dL — ABNORMAL HIGH (ref 210–475)

## 2018-12-18 LAB — FIBRIN DERIVATIVES D-DIMER (ARMC ONLY): Fibrin derivatives D-dimer (ARMC): 2680.78 ng/mL (FEU) — ABNORMAL HIGH (ref 0.00–499.00)

## 2018-12-18 LAB — PROCALCITONIN: Procalcitonin: <0.1 ng/mL

## 2018-12-18 MED ORDER — SODIUM CHLORIDE 0.9 % IV BOLUS
1000.0000 mL | Freq: Once | INTRAVENOUS | Status: AC
  Administered 2018-12-18: 1000 mL via INTRAVENOUS

## 2018-12-18 NOTE — Telephone Encounter (Signed)
Calling to report positive COVID testing on patient- trying to reach infectious disease- she will call them directly.

## 2018-12-18 NOTE — ED Notes (Signed)
Pt with [facility] ground transport at this time. Pt in NAD while leaving ED.   DNR in paperwork packet, earrings placed with bed pad in pts belongings bag.

## 2018-12-18 NOTE — ED Notes (Signed)
Pt placed on 6L  due to decreased oxygen saturation.

## 2018-12-18 NOTE — ED Triage Notes (Signed)
Pt here with via EMS from [facility], Covid +, c/o increasing shob over the past day, denies pain, alert and oriented to place, self and time. 4L [place] sats 95% currently. Pt in no distress.

## 2018-12-18 NOTE — ED Notes (Signed)
[facility] ground transport updated in pts status change.

## 2018-12-18 NOTE — ED Notes (Signed)
Pt calm, resting, no wheezing at this time. Pt unable to speak in full sentences, however states she is comfortable. Sats 96% on 4L.

## 2018-12-18 NOTE — ED Provider Notes (Addendum)
[facility] Emergency Department Provider Note  ____________________________________________  Time seen: Approximately 11:14 AM  I have reviewed the triage vital signs and the nursing notes.   HISTORY  Chief Complaint Shortness of breath   HPI Annette CzechMary G Combs is a 85 y.o. female with a history of hypertension, depression, GERD, CAD  who is known to have novel coronavirus infection sent [facility] ED [facility] due to worsening shortness of breath and low oxygen saturation.  EMS report room air oxygen saturation of about 78%, increased to 93% on 4 L nasal cannula.  Patient complains of shortness of breath is been gradually worsening for a week.  Nonproductive cough as well.  Denies fever or chest pain.  Symptoms are constant, worse with movement, worse lying down, better sitting upright.     Past Medical History:  Diagnosis Date  . Acid reflux   . Anemia   . Anxiety   . Arthritis   . Asthma   . Atrophic vaginitis   . Depression   . Depression   . Hypertension   . Hypertension   . Incontinence   . Mitral valve disorder   . Nocturia   . Suicide attempt [facility](HCC)      Patient Active Problem List   Diagnosis Date Noted  . Anxiety 02/27/2016  . B12 deficiency 08/21/2015  . Moderate episode of recurrent major depressive disorder (HCC) 08/19/2015  . Urinary incontinence 02/25/2015  . Atrophic vaginitis 02/25/2015  . High risk medication use 01/28/2015  . Immunizations incomplete 12/23/2014  . Anemia 01/17/2014  . CAD (coronary artery disease) 01/17/2014  . GERD (gastroesophageal reflux disease) 01/17/2014  . Hypertension 01/17/2014  . 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 Noncompliance with medication regimen 01/17/2014  . Pure hypercholesterolemia 01/17/2014  . H/O calcium pyrophosphate deposition disease (CPPD) 12/16/2013  . Osteoarthritis 12/16/2013  . Osteoarthrosis 12/16/2013  . Personal history of other diseases of the musculoskeletal system and  connective tissue 12/16/2013     Past Surgical History:  Procedure Laterality Date  . ABDOMINAL HYSTERECTOMY  1984  . REPLACEMENT TOTAL KNEE  2006     Prior to Admission medications   Medication Sig Start Date End Date Taking? Authorizing Provider  acetaminophen (TYLENOL) 500 MG tablet Take 500 mg by mouth every 6 (six) hours as needed.    [provider]  amLODipine (NORVASC) 10 MG tablet Take 10 mg by mouth daily.    [provider]  aspirin 81 MG tablet Take 81 mg by mouth. 05/03/12   [provider]  atorvastatin (LIPITOR) 40 MG tablet Take 40 mg by mouth daily.    [provider]  docusate sodium (STOOL SOFTENER) 100 MG capsule Take 100 mg by mouth. 05/03/12   [provider]  estradiol (ESTRACE) 0.1 MG/GM vaginal cream Place 1 Applicatorful vaginally at bedtime. Patient not taking: Reported on 03/29/2016 06/11/15   Michiel CowboyMcGowan, Shannon A, PA-C  ferrous sulfate (SM IRON) 325 (65 FE) MG tablet Take 325 mg by mouth.    [provider]  FLUoxetine (PROZAC) 40 MG capsule Take 40 mg by mouth. 02/11/17   [provider]  ibuprofen (ADVIL,MOTRIN) 200 MG tablet Take 200 mg by mouth.    [provider]  losartan (COZAAR) 100 MG tablet Take 100 mg by mouth daily.    [provider]  losartan-hydrochlorothiazide (HYZAAR) 100-25 MG per tablet Take by mouth. 05/03/12   [provider]  metoprolol (LOPRESSOR) 50 MG tablet Take 50 mg by  mouth. 05/03/12   [provider]  metoprolol succinate (TOPROL-XL) 50 MG 24 hr tablet Take 50 mg by mouth daily. Take with or immediately following a meal.    [provider]  niMODipine (NIMOTOP) 30 MG capsule Take 30 mg by mouth every 4 (four) hours.    [provider]  omeprazole (PRILOSEC) 20 MG capsule Take 20 mg by mouth daily.    [provider]  ondansetron (ZOFRAN ODT) 4 MG disintegrating tablet Take 4 mg by mouth. 05/03/12   [provider]  oxybutynin (DITROPAN XL) 15 MG 24 hr tablet TAKE 1 TABLET (15 MG TOTAL) BY MOUTH AT BEDTIME. Patient not taking: Reported on 03/29/2017 03/11/16   Michiel Cowboy A, PA-C  oxybutynin (DITROPAN XL) 15 MG 24 hr tablet Take 1 tablet (15 mg total) by mouth at bedtime. Patient not taking: Reported on 03/29/2017 03/29/16   Michiel Cowboy A, PA-C  oxyCODONE-acetaminophen (PERCOCET/ROXICET) 5-325 MG per tablet Take 1 tablet by mouth every 4 (four) hours as needed for severe pain.    [provider]  polyethylene glycol [facility]) packet Take by mouth. 05/03/12   [provider]  potassium chloride (K-DUR,KLOR-CON) 10 MEQ tablet Take 10 mEq by mouth 2 (two) times daily.    [provider]  predniSONE (DELTASONE) 5 MG tablet Take 5 mg by mouth. 01/18/15   [provider]  rivaroxaban (XARELTO) 20 MG TABS tablet Take 20 mg by mouth.    [provider]  tamsulosin (FLOMAX) 0.4 MG CAPS capsule Take 1 capsule (0.4 mg total) by mouth daily. 03/29/17   Michiel Cowboy A, PA-C  valsartan-hydrochlorothiazide (DIOVAN-HCT) 320-25 MG per tablet Take 1 tablet by mouth daily.    [provider]  vitamin B-12 (CYANOCOBALAMIN) 1000 MCG tablet Take by mouth.    [provider]     Allergies Patient has no known allergies.   Family History  Problem Relation Age of Onset  . Kidney disease Neg Hx   . Bladder Cancer Neg Hx   . Kidney cancer Neg Hx     Social History Social History   Tobacco Use  . Smoking status: Never Smoker  . Smokeless tobacco: Never Used  Substance Use Topics  . Alcohol use: No    Alcohol/week: 0.0 standard drinks  . Drug use: No    Review of Systems  Constitutional:   No fever or chills.  ENT:   No sore throat. No rhinorrhea. Cardiovascular:   No chest pain or syncope. Respiratory:   Positive shortness of breath and cough. Gastrointestinal:   Negative for abdominal pain, vomiting and diarrhea.   Musculoskeletal:   Negative for focal pain or swelling All other systems reviewed and are negative except as documented above in ROS and HPI.  ____________________________________________   PHYSICAL EXAM:  VITAL SIGNS: ED Triage Vitals  Enc Vitals Group     BP      Pulse      Resp      Temp      Temp src      SpO2      Weight      Height      Head Circumference      Peak Flow      Pain Score      Pain Loc      Pain Edu?      Excl. in GC?     Vital signs reviewed, nursing assessments reviewed.   Constitutional:   Alert and oriented.  Ill-appearing. Eyes:   Conjunctivae are normal. EOMI. PERRL. ENT      Head:   Normocephalic and atraumatic.      Nose:   No congestion/rhinnorhea.       Mouth/Throat:   MMM, no pharyngeal erythema. No peritonsillar mass.       Neck:   No meningismus. Full ROM. Hematological/Lymphatic/Immunilogical:   No cervical lymphadenopathy. Cardiovascular:   RRR. Symmetric bilateral radial and DP pulses.  No murmurs. Cap refill less than 2 seconds. Respiratory: Tachypnea with normal work of breathing.  Diffuse expiratory wheezing.  No focal crackles.  Symmetric breath sounds. Gastrointestinal:   Soft and nontender. Non distended. There is no CVA tenderness.  No rebound, rigidity, or guarding.  Musculoskeletal:   Normal range of motion in all extremities. No joint effusions.  No lower extremity tenderness.  No edema. Neurologic:   Normal speech and language.  Motor grossly intact. No acute focal neurologic deficits are appreciated.  Skin:    Skin is warm, dry and intact. No rash noted.  No petechiae, purpura, or bullae.  ____________________________________________    LABS (pertinent positives/negatives) (all labs ordered are listed, but only abnormal results are displayed) Labs Reviewed  CULTURE, BLOOD (ROUTINE X 2)  CULTURE, BLOOD (ROUTINE X 2)  LACTIC ACID, PLASMA  LACTIC ACID, PLASMA  CBC WITH DIFFERENTIAL/PLATELET  COMPREHENSIVE  METABOLIC PANEL  FIBRIN DERIVATIVES D-DIMER ([facility] ONLY)  PROCALCITONIN  LACTATE DEHYDROGENASE  FERRITIN  TRIGLYCERIDES  FIBRINOGEN  C-REACTIVE PROTEIN   ____________________________________________   EKG    ____________________________________________    RADIOLOGY  No results found.  ____________________________________________   PROCEDURES .Critical Care Performed by: Sharman Cheek, MD Authorized by: Sharman Cheek, MD   Critical care provider statement:    Critical care time (minutes):  35   Critical care time was exclusive of:  Separately billable procedures and treating other patients   Critical care was necessary to treat or prevent imminent or life-threatening deterioration of the following conditions:  Respiratory failure   Critical care was time spent personally by me on the following activities:  Development of treatment plan with patient or surrogate, discussions with consultants, evaluation of patient's response to treatment, examination of patient, obtaining history from patient or surrogate, ordering and performing treatments and interventions, ordering and review of laboratory studies, ordering and review of radiographic studies, pulse oximetry, re-evaluation of patient's condition and review of old charts    ____________________________________________    CLINICAL IMPRESSION / ASSESSMENT AND PLAN / ED COURSE  Medications ordered in the ED: Medications - No data to display  Pertinent labs & imaging results that were available during my care of the patient were reviewed by me and considered in my medical decision making (see chart for details).  Annette Combs was evaluated in Emergency Department on 12/18/2018 for the symptoms described in the history of present illness. She was evaluated in the context of the global COVID-19 pandemic, which necessitated consideration that the patient might be at risk for infection with the SARS-CoV-2 virus that  causes COVID-19. Institutional protocols and algorithms that pertain to the evaluation of patients at risk for COVID-19 are in a state of rapid change based on information released by regulatory bodies including the CDC and federal and state organizations. These policies and algorithms were followed during the patient's care in the ED.   Patient presents with respiratory distress in the setting of COVID-19 infection.  She is hypoxic, but oxygenation stabilized to 93 or 94% on 4 L nasal cannula.  Will get a chest x-ray to evaluate for possible pulmonary edema, pleural effusion, consolidative pneumonia, but most likely this is viral pneumonitis.  Patient agrees to hospitalization and to transfer to [facility] for health system cohort doing of COVID patients in the midst of this pandemic and for specialized COVID centered care.  Doubt sepsis, not in shock.  DNR paperwork on chart.  Clinical Course as of Dec 17 1398  Wed Dec 18, 2018  1111 Patient From [facility], COVID positive patient from [facility], known COVID positive, sent to the ED due to worsening shortness of breath and hypoxia.  Wheezing, tachypnea.  Oxygenation 94% on 4 L nasal cannula.  Patient confirms DNR status.  Agrees to transfer to [facility].  Will check labs, chest x-ray, plan for admission.   [PS]  1157 D/w daughter Mariana Single, who indicates preference for [facility] as transfer hospital. I will contact [facility] to see if they have ability to accept the patient. Confirmed DNR status with daughter as well.   [PS]  1247 Elevated due to COVID. Doubt PE  Fibrin derivatives D-dimer [facility])(!): [phone] [PS]  1315 I discussed the case with the [facility] medical admitting officer, who needed to confer with their COVID team to ensure they can provide appropriate care for the patient.  The [facility] did later call to inform us that the patient is being accepted for transfer to [facility]. Awaiting bed assignment.   [PS]  1349 Oxygenation improved to  97%.  With improved oxygenation, patient is breathing more comfortably and wheezing has diminished just based on change in position and effort.  She has not received steroids or bronchodilators in the ED.   [PS]    Clinical Course User Index [PS] Sharman Cheek, MD     ----------------------------------------- 2:00 PM on 12/18/2018 -----------------------------------------  Accepted by Lucienne Minks, attending Maggie Schwalbe..  Blood pressure trended downward although map is still normal.  I will give a 1 L IV fluid bolus.  Oxygen levels 91% on the 4 L.  We will try repositioning but also increased to 6 L nasal cannula.  Confirmed on nursing home paperwork patient was diagnosed COVID + Dec 10, 2018.  Procalcitonin negative.  Antibiotics not indicated without evidence of bacterial pneumonia.  ----------------------------------------- 2:31 PM on 12/18/2018 -----------------------------------------  Oxygenation worse at 90%.  Patient seems more drowsy.  Will check a VBG and put her on high flow nasal cannula.  ____________________________________________   FINAL CLINICAL IMPRESSION(S) / ED DIAGNOSES    Final diagnoses:  COVID-19 virus infection  Acute respiratory failure with hypoxia [facility])     ED Discharge Orders    None      Portions of this note were generated with dragon dictation software. Dictation errors may occur despite best attempts at proofreading.   Sharman Cheek, MD 12/18/18 1401    Sharman Cheek, MD 12/18/18 1402    Sharman Cheek, MD 12/18/18 [phone]

## 2018-12-18 NOTE — ED Notes (Signed)
Bed locked and low. Pt denies complaints, rise and fall of chest noted. Monitor attached, this RN at bedside.

## 2018-12-18 NOTE — ED Notes (Signed)
Upon reassessment pt is sleeping and more drowsy than previously. Pt is able to answer questions but falls back to sleep immediately. Pt is not able to roll or assist with self care at this time and reports, " I am just too sore." MD made aware of increased drowsiness.

## 2018-12-19 MED ORDER — HYDRALAZINE HCL 10 MG PO TABS
10.00 | ORAL_TABLET | ORAL | Status: DC
Start: 2018-12-19 — End: 2018-12-19

## 2018-12-19 MED ORDER — PREDNISONE 20 MG PO TABS
20.00 | ORAL_TABLET | ORAL | Status: DC
Start: 2018-12-19 — End: 2018-12-19

## 2018-12-19 MED ORDER — ENOXAPARIN SODIUM 40 MG/0.4ML ~~LOC~~ SOLN
40.00 | SUBCUTANEOUS | Status: DC
Start: 2018-12-19 — End: 2018-12-19

## 2018-12-19 MED ORDER — PANTOPRAZOLE SODIUM 20 MG PO TBEC
20.00 | DELAYED_RELEASE_TABLET | ORAL | Status: DC
Start: 2018-12-19 — End: 2018-12-19

## 2018-12-19 MED ORDER — ACETAMINOPHEN 500 MG PO TABS
1000.00 | ORAL_TABLET | ORAL | Status: DC
Start: ? — End: 2018-12-19

## 2018-12-19 MED ORDER — BUSPIRONE HCL 10 MG PO TABS
10.00 | ORAL_TABLET | ORAL | Status: DC
Start: 2018-12-19 — End: 2018-12-19

## 2018-12-19 MED ORDER — POLYETHYLENE GLYCOL 3350 17 G PO PACK
17.00 | PACK | ORAL | Status: DC
Start: 2018-12-19 — End: 2018-12-19

## 2018-12-19 MED ORDER — ALBUTEROL SULFATE HFA 108 (90 BASE) MCG/ACT IN AERS
2.00 | INHALATION_SPRAY | RESPIRATORY_TRACT | Status: DC
Start: 2018-12-19 — End: 2018-12-19

## 2018-12-19 MED ORDER — ASPIRIN 81 MG PO CHEW
81.00 | CHEWABLE_TABLET | ORAL | Status: DC
Start: 2018-12-19 — End: 2018-12-19

## 2018-12-19 MED ORDER — ATORVASTATIN CALCIUM 40 MG PO TABS
40.00 | ORAL_TABLET | ORAL | Status: DC
Start: 2018-12-19 — End: 2018-12-19

## 2018-12-19 MED ORDER — VITAMIN B-12 1000 MCG PO TABS
1000.00 | ORAL_TABLET | ORAL | Status: DC
Start: 2018-12-19 — End: 2018-12-19

## 2018-12-19 MED ORDER — ESCITALOPRAM OXALATE 20 MG PO TABS
20.00 | ORAL_TABLET | ORAL | Status: DC
Start: 2018-12-19 — End: 2018-12-19

## 2018-12-19 MED ORDER — GENERIC EXTERNAL MEDICATION
1.00 | Status: DC
Start: 2018-12-19 — End: 2018-12-19

## 2018-12-23 LAB — CULTURE, BLOOD (ROUTINE X 2)
Culture: NO GROWTH
Culture: NO GROWTH
Special Requests: ADEQUATE
Special Requests: ADEQUATE

## 2020-06-13 ENCOUNTER — Inpatient Hospital Stay: Payer: Medicare Other

## 2020-06-13 ENCOUNTER — Inpatient Hospital Stay
Admission: EM | Admit: 2020-06-13 | Discharge: 2020-06-16 | DRG: 124 | Disposition: A | Discharge status: Skilled Nursing Facility | Payer: Medicare Other | Attending: Internal Medicine | Admitting: Internal Medicine

## 2020-06-13 ENCOUNTER — Emergency Department: Payer: Medicare Other

## 2020-06-13 ENCOUNTER — Other Ambulatory Visit: Payer: Self-pay

## 2020-06-13 DIAGNOSIS — M79601 Pain in right arm: Secondary | ICD-10-CM | POA: Diagnosis present

## 2020-06-13 DIAGNOSIS — M79652 Pain in left thigh: Secondary | ICD-10-CM | POA: Diagnosis present

## 2020-06-13 DIAGNOSIS — Z6841 Body Mass Index (BMI) 40.0 and over, adult: Secondary | ICD-10-CM

## 2020-06-13 DIAGNOSIS — Z66 Do not resuscitate: Secondary | ICD-10-CM | POA: Diagnosis present

## 2020-06-13 DIAGNOSIS — E876 Hypokalemia: Secondary | ICD-10-CM | POA: Diagnosis not present

## 2020-06-13 DIAGNOSIS — H0520 Unspecified exophthalmos: Secondary | ICD-10-CM | POA: Diagnosis present

## 2020-06-13 DIAGNOSIS — H1132 Conjunctival hemorrhage, left eye: Secondary | ICD-10-CM | POA: Diagnosis present

## 2020-06-13 DIAGNOSIS — F32A Depression, unspecified: Secondary | ICD-10-CM | POA: Diagnosis present

## 2020-06-13 DIAGNOSIS — D6959 Other secondary thrombocytopenia: Secondary | ICD-10-CM | POA: Diagnosis present

## 2020-06-13 DIAGNOSIS — S0011XA Contusion of right eyelid and periocular area, initial encounter: Secondary | ICD-10-CM | POA: Diagnosis present

## 2020-06-13 DIAGNOSIS — S058X2A Other injuries of left eye and orbit, initial encounter: Secondary | ICD-10-CM

## 2020-06-13 DIAGNOSIS — I10 Essential (primary) hypertension: Secondary | ICD-10-CM | POA: Diagnosis present

## 2020-06-13 DIAGNOSIS — I2510 Atherosclerotic heart disease of native coronary artery without angina pectoris: Secondary | ICD-10-CM | POA: Diagnosis present

## 2020-06-13 DIAGNOSIS — J45909 Unspecified asthma, uncomplicated: Secondary | ICD-10-CM | POA: Diagnosis present

## 2020-06-13 DIAGNOSIS — W19XXXA Unspecified fall, initial encounter: Secondary | ICD-10-CM | POA: Diagnosis present

## 2020-06-13 DIAGNOSIS — M1990 Unspecified osteoarthritis, unspecified site: Secondary | ICD-10-CM | POA: Diagnosis present

## 2020-06-13 DIAGNOSIS — G9341 Metabolic encephalopathy: Secondary | ICD-10-CM | POA: Diagnosis present

## 2020-06-13 DIAGNOSIS — D509 Iron deficiency anemia, unspecified: Secondary | ICD-10-CM | POA: Diagnosis present

## 2020-06-13 DIAGNOSIS — Z7982 Long term (current) use of aspirin: Secondary | ICD-10-CM

## 2020-06-13 DIAGNOSIS — R3129 Other microscopic hematuria: Secondary | ICD-10-CM | POA: Diagnosis present

## 2020-06-13 DIAGNOSIS — N390 Urinary tract infection, site not specified: Secondary | ICD-10-CM | POA: Diagnosis present

## 2020-06-13 DIAGNOSIS — E7800 Pure hypercholesterolemia, unspecified: Secondary | ICD-10-CM | POA: Diagnosis present

## 2020-06-13 DIAGNOSIS — W06XXXA Fall from bed, initial encounter: Secondary | ICD-10-CM | POA: Diagnosis present

## 2020-06-13 DIAGNOSIS — S0592XA Unspecified injury of left eye and orbit, initial encounter: Secondary | ICD-10-CM

## 2020-06-13 DIAGNOSIS — M25552 Pain in left hip: Secondary | ICD-10-CM | POA: Diagnosis present

## 2020-06-13 DIAGNOSIS — Z7901 Long term (current) use of anticoagulants: Secondary | ICD-10-CM

## 2020-06-13 DIAGNOSIS — R52 Pain, unspecified: Secondary | ICD-10-CM

## 2020-06-13 DIAGNOSIS — S0590XA Unspecified injury of unspecified eye and orbit, initial encounter: Secondary | ICD-10-CM | POA: Diagnosis present

## 2020-06-13 DIAGNOSIS — Y92122 Bedroom in nursing home as the place of occurrence of the external cause: Secondary | ICD-10-CM

## 2020-06-13 DIAGNOSIS — H269 Unspecified cataract: Secondary | ICD-10-CM | POA: Diagnosis present

## 2020-06-13 DIAGNOSIS — K219 Gastro-esophageal reflux disease without esophagitis: Secondary | ICD-10-CM | POA: Diagnosis present

## 2020-06-13 DIAGNOSIS — Z20822 Contact with and (suspected) exposure to covid-19: Secondary | ICD-10-CM | POA: Diagnosis present

## 2020-06-13 DIAGNOSIS — Z7952 Long term (current) use of systemic steroids: Secondary | ICD-10-CM

## 2020-06-13 DIAGNOSIS — R4182 Altered mental status, unspecified: Secondary | ICD-10-CM

## 2020-06-13 DIAGNOSIS — S0592XD Unspecified injury of left eye and orbit, subsequent encounter: Secondary | ICD-10-CM | POA: Diagnosis not present

## 2020-06-13 DIAGNOSIS — Z9151 Personal history of suicidal behavior: Secondary | ICD-10-CM

## 2020-06-13 DIAGNOSIS — Z96659 Presence of unspecified artificial knee joint: Secondary | ICD-10-CM | POA: Diagnosis present

## 2020-06-13 DIAGNOSIS — Z79899 Other long term (current) drug therapy: Secondary | ICD-10-CM

## 2020-06-13 LAB — CBC WITH DIFFERENTIAL/PLATELET
Abs Immature Granulocytes: 0.07 10*3/uL (ref 0.00–0.07)
Basophils Absolute: 0 10*3/uL (ref 0.0–0.1)
Basophils Relative: 0 %
Eosinophils Absolute: 0 10*3/uL (ref 0.0–0.5)
Eosinophils Relative: 1 %
HCT: 38.9 % (ref 36.0–46.0)
Hemoglobin: 12.4 g/dL (ref 12.0–15.0)
Immature Granulocytes: 1 %
Lymphocytes Relative: 30 %
Lymphs Abs: 1.8 10*3/uL (ref 0.7–4.0)
MCH: 32 pg (ref 26.0–34.0)
MCHC: 31.9 g/dL (ref 30.0–36.0)
MCV: 100.5 fL — ABNORMAL HIGH (ref 80.0–100.0)
Monocytes Absolute: 0.8 10*3/uL (ref 0.1–1.0)
Monocytes Relative: 14 %
Neutro Abs: 3.3 10*3/uL (ref 1.7–7.7)
Neutrophils Relative %: 54 %
Platelets: 165 10*3/uL (ref 150–400)
RBC: 3.87 MIL/uL (ref 3.87–5.11)
RDW: 16 % — ABNORMAL HIGH (ref 11.5–15.5)
WBC: 6 10*3/uL (ref 4.0–10.5)
nRBC: 0 % (ref 0.0–0.2)

## 2020-06-13 LAB — URINALYSIS, COMPLETE (UACMP) WITH MICROSCOPIC
Bilirubin Urine: NEGATIVE
Glucose, UA: NEGATIVE mg/dL
Hgb urine dipstick: NEGATIVE
Ketones, ur: NEGATIVE mg/dL
Nitrite: NEGATIVE
Protein, ur: 30 mg/dL — AB
Specific Gravity, Urine: 1.026 (ref 1.005–1.030)
WBC, UA: >50 WBC/hpf — ABNORMAL HIGH (ref 0–5)
pH: 5 (ref 5.0–8.0)

## 2020-06-13 LAB — COMPREHENSIVE METABOLIC PANEL
ALT: 14 U/L (ref 0–44)
AST: 19 U/L (ref 15–41)
Albumin: 4.1 g/dL (ref 3.5–5.0)
Alkaline Phosphatase: 51 U/L (ref 38–126)
Anion gap: 9 (ref 5–15)
BUN: 12 mg/dL (ref 8–23)
CO2: 33 mmol/L — ABNORMAL HIGH (ref 22–32)
Calcium: 9.8 mg/dL (ref 8.9–10.3)
Chloride: 99 mmol/L (ref 98–111)
Creatinine, Ser: 0.45 mg/dL (ref 0.44–1.00)
GFR, Estimated: >60 mL/min (ref >60)
Glucose, Bld: 102 mg/dL — ABNORMAL HIGH (ref 70–99)
Potassium: 3.7 mmol/L (ref 3.5–5.1)
Sodium: 141 mmol/L (ref 135–145)
Total Bilirubin: 1 mg/dL (ref 0.3–1.2)
Total Protein: 7.3 g/dL (ref 6.5–8.1)

## 2020-06-13 LAB — TROPONIN I (HIGH SENSITIVITY)
Troponin I (High Sensitivity): 6 ng/L (ref <18)
Troponin I (High Sensitivity): 5 ng/L (ref <18)

## 2020-06-13 LAB — LACTIC ACID, PLASMA: Lactic Acid, Venous: 1.4 mmol/L (ref 0.5–1.9)

## 2020-06-13 LAB — PROTIME-INR
INR: 1 (ref 0.8–1.2)
Prothrombin Time: 13.2 seconds (ref 11.4–15.2)

## 2020-06-13 LAB — RESP PANEL BY RT-PCR (FLU A&B, COVID) ARPGX2
Influenza A by PCR: NEGATIVE
Influenza B by PCR: NEGATIVE
SARS Coronavirus 2 by RT PCR: NEGATIVE

## 2020-06-13 LAB — BRAIN NATRIURETIC PEPTIDE: B Natriuretic Peptide: 24.2 pg/mL (ref 0.0–100.0)

## 2020-06-13 LAB — APTT: aPTT: 28 seconds (ref 24–36)

## 2020-06-13 MED ORDER — ALBUTEROL SULFATE (2.5 MG/3ML) 0.083% IN NEBU: 2.5000 mg | INHALATION_SOLUTION | RESPIRATORY_TRACT | Status: DC | PRN

## 2020-06-13 MED ORDER — VALSARTAN-HYDROCHLOROTHIAZIDE 320-25 MG PO TABS: 1.0000 | ORAL_TABLET | Freq: Every day | ORAL | Status: DC

## 2020-06-13 MED ORDER — HYDRALAZINE HCL 20 MG/ML IJ SOLN: 5.0000 mg | INTRAMUSCULAR | Status: DC | PRN

## 2020-06-13 MED ORDER — ACETAMINOPHEN 325 MG PO TABS
650.0000 mg | ORAL_TABLET | Freq: Four times a day (QID) | ORAL | Status: DC | PRN
  Administered 2020-06-13 (×2): 650 mg via ORAL
  Filled 2020-06-13 (×2): qty 2

## 2020-06-13 MED ORDER — LATANOPROST 0.005 % OP SOLN
1.0000 [drp] | Freq: Every day | OPHTHALMIC | Status: DC
  Administered 2020-06-13 – 2020-06-15 (×3): 1 [drp] via OPHTHALMIC
  Filled 2020-06-13: qty 2.5

## 2020-06-13 MED ORDER — BUSPIRONE HCL 10 MG PO TABS
10.0000 mg | ORAL_TABLET | Freq: Three times a day (TID) | ORAL | Status: DC
  Administered 2020-06-13 – 2020-06-16 (×9): 10 mg via ORAL
  Filled 2020-06-13 (×10): qty 1

## 2020-06-13 MED ORDER — ALBUTEROL SULFATE HFA 108 (90 BASE) MCG/ACT IN AERS
2.0000 | INHALATION_SPRAY | RESPIRATORY_TRACT | Status: DC | PRN
  Filled 2020-06-13: qty 6.7

## 2020-06-13 MED ORDER — PREDNISONE 10 MG PO TABS
15.0000 mg | ORAL_TABLET | Freq: Every day | ORAL | Status: DC
  Administered 2020-06-14 – 2020-06-16 (×3): 15 mg via ORAL
  Filled 2020-06-13 (×4): qty 2

## 2020-06-13 MED ORDER — ATORVASTATIN CALCIUM 20 MG PO TABS
40.0000 mg | ORAL_TABLET | Freq: Every day | ORAL | Status: DC
  Administered 2020-06-14 – 2020-06-16 (×3): 40 mg via ORAL
  Filled 2020-06-13 (×4): qty 2

## 2020-06-13 MED ORDER — IRBESARTAN 150 MG PO TABS
300.0000 mg | ORAL_TABLET | Freq: Every day | ORAL | Status: DC
  Administered 2020-06-14 – 2020-06-16 (×3): 300 mg via ORAL
  Filled 2020-06-13 (×3): qty 2

## 2020-06-13 MED ORDER — OXYCODONE-ACETAMINOPHEN 5-325 MG PO TABS: 1.0000 | ORAL_TABLET | ORAL | Status: DC | PRN

## 2020-06-13 MED ORDER — MAGNESIUM OXIDE 400 (241.3 MG) MG PO TABS
400.0000 mg | ORAL_TABLET | Freq: Every day | ORAL | Status: DC
  Administered 2020-06-14 – 2020-06-16 (×3): 400 mg via ORAL
  Filled 2020-06-13 (×3): qty 1

## 2020-06-13 MED ORDER — FERROUS SULFATE 325 (65 FE) MG PO TABS
325.0000 mg | ORAL_TABLET | Freq: Every day | ORAL | Status: DC
  Administered 2020-06-14 – 2020-06-16 (×3): 325 mg via ORAL
  Filled 2020-06-13 (×3): qty 1

## 2020-06-13 MED ORDER — HYDROCHLOROTHIAZIDE 25 MG PO TABS
25.0000 mg | ORAL_TABLET | Freq: Every day | ORAL | Status: DC
  Administered 2020-06-14 – 2020-06-16 (×3): 25 mg via ORAL
  Filled 2020-06-13 (×3): qty 1

## 2020-06-13 MED ORDER — HYDROCORTISONE NA SUCCINATE PF 100 MG IJ SOLR
50.0000 mg | Freq: Once | INTRAMUSCULAR | Status: AC
  Administered 2020-06-13: 50 mg via INTRAVENOUS
  Filled 2020-06-13: qty 1

## 2020-06-13 MED ORDER — ONDANSETRON HCL 4 MG/2ML IJ SOLN: 4.0000 mg | Freq: Three times a day (TID) | INTRAMUSCULAR | Status: DC | PRN

## 2020-06-13 MED ORDER — ESCITALOPRAM OXALATE 10 MG PO TABS
10.0000 mg | ORAL_TABLET | Freq: Every day | ORAL | Status: DC
  Administered 2020-06-13 – 2020-06-16 (×4): 10 mg via ORAL
  Filled 2020-06-13 (×4): qty 1

## 2020-06-13 MED ORDER — AMLODIPINE BESYLATE 10 MG PO TABS
10.0000 mg | ORAL_TABLET | Freq: Every day | ORAL | Status: DC
  Administered 2020-06-14 – 2020-06-16 (×3): 10 mg via ORAL
  Filled 2020-06-13 (×3): qty 1

## 2020-06-13 MED ORDER — SENNA 8.6 MG PO TABS
2.0000 | ORAL_TABLET | Freq: Every day | ORAL | Status: DC
  Administered 2020-06-13 – 2020-06-15 (×3): 17.2 mg via ORAL
  Filled 2020-06-13 (×2): qty 2

## 2020-06-13 MED ORDER — SODIUM CHLORIDE 0.9 % IV SOLN: INTRAVENOUS | Status: DC

## 2020-06-13 MED ORDER — DM-GUAIFENESIN ER 30-600 MG PO TB12
1.0000 | ORAL_TABLET | Freq: Two times a day (BID) | ORAL | Status: DC | PRN
  Administered 2020-06-13 – 2020-06-14 (×2): 1 via ORAL
  Filled 2020-06-13 (×3): qty 1

## 2020-06-13 MED ORDER — PANTOPRAZOLE SODIUM 40 MG PO TBEC
40.0000 mg | DELAYED_RELEASE_TABLET | Freq: Every day | ORAL | Status: DC
  Administered 2020-06-14 – 2020-06-16 (×3): 40 mg via ORAL
  Filled 2020-06-13 (×3): qty 1

## 2020-06-13 MED ORDER — POLYETHYLENE GLYCOL 3350 17 G PO PACK: 17.0000 g | PACK | Freq: Every day | ORAL | Status: DC | PRN

## 2020-06-13 MED ORDER — VITAMIN D 25 MCG (1000 UNIT) PO TABS
4000.0000 [IU] | ORAL_TABLET | Freq: Every day | ORAL | Status: DC
  Administered 2020-06-14 – 2020-06-16 (×3): 4000 [IU] via ORAL
  Filled 2020-06-13 (×3): qty 4

## 2020-06-13 NOTE — H&P (Addendum)
History and Physical    Annette Combs GYF:[id number] DOB: Nov 20, 1928 DOA: 06/13/2020  Referring MD/NP/PA:   PCP: Mickey Farber, MD   Patient coming from:  The patient is coming from SNF home.  At baseline, pt is dependent for most of ADL.        Chief Complaint: fall, left eye injury, AMS  HPI: Annette Combs is a 86 y.o. female with medical history significant of hypertension, hyperlipidemia, asthma, GERD, depression, anxiety, anemia, urinary incontinence, CAD, CPPD, who presents with fall and left eye injury, and AMS  Per report, patient is usually alert and conversant, but became more confused last evening.  Although nursing staff states a sitter was in the patient's room, patient fell from her bed, striking her left face. Pt is unable to recall events. Pt injured her left eye with little bleeding from left eye. Pt also complains of pain in her left hip and left upper thigh.  Patient does not seem to have chest pain or abdominal pain.  No active nausea, vomiting, respiratory distress noted.  Patient is on oxygen in facility per her daughter.  Not sure if patient has symptoms of UTI.  Patient moves all extremities.  No facial droop or slurred speech.  ED Course: pt was found to have WBC 6.0, troponin level 5, lactic acid 1.4, negative Covid PCR, electrolytes renal function okay, temperature normal, blood pressure 164/71, heart rate 113, 89, RR 20, oxygen saturation 97% on 3 L oxygen.  Chest x-ray showed cardiomegaly otherwise negative.  CT of head is negative for acute intracranial abnormalities CT of C-spine is negative for acute issues, but showed degenerative disc disease.  CT of her maxillofacial image showed left eye injury.  Patient is admitted to MedSurg bed as inpatient by accepting MD.  Ophthalmology, Dr. Druscilla Brownie is consulted.  CT-head/C spin/maxillofacial 1. Hemorrhage/injury at the junction of the LEFT optic nerve and LEFT globe. LEFT proptosis without evidence of gross  intraglobal abnormality. LEFT preseptal soft tissue swelling without orbital fracture. 2. No evidence of acute intracranial abnormality. Moderate chronic small-vessel white matter ischemic changes. 3. No static evidence of acute injury to the cervical spine. Moderate multilevel degenerative disc disease/spondylosis and facet arthropathy. 4. RIGHT mastoid effusion without identifiable mastoid fracture. Middle and inner ears are clear.    Review of Systems: Could not be reviewed accurately due to altered mental status.  Allergy: No Known Allergies  Past Medical History:  Diagnosis Date  . Acid reflux   . Anemia   . Anxiety   . Arthritis   . Asthma   . Atrophic vaginitis   . Depression   . Depression   . Hypertension   . Hypertension   . Incontinence   . Mitral valve disorder   . Nocturia   . Suicide attempt [facility])     Past Surgical History:  Procedure Laterality Date  . ABDOMINAL HYSTERECTOMY  1984  . REPLACEMENT TOTAL KNEE  2006    Social History:  reports that she has never smoked. She has never used smokeless tobacco. She reports that she does not drink alcohol and does not use drugs.  Family History:  Family History  Problem Relation Age of Onset  . Kidney disease Neg Hx   . Bladder Cancer Neg Hx   . Kidney cancer Neg Hx      Prior to Admission medications   Medication Sig Start Date End Date Taking? Authorizing Provider  acetaminophen (TYLENOL) 500 MG tablet Take 500 mg by mouth every  6 (six) hours as needed.   Yes [provider]  albuterol (VENTOLIN HFA) 108 (90 Base) MCG/ACT inhaler Inhale 1-2 puffs into the lungs every 6 (six) hours as needed for wheezing or shortness of breath.   Yes [provider]  amLODipine (NORVASC) 10 MG tablet Take 10 mg by mouth daily.   Yes [provider]  aspirin 81 MG tablet Take 81 mg by mouth. 05/03/12  Yes [provider]  atorvastatin (LIPITOR) 40 MG tablet Take 40 mg by mouth daily.    Yes [provider]  busPIRone (BUSPAR) 10 MG tablet Take 10 mg by mouth 3 (three) times daily.   Yes [provider]  cholecalciferol (VITAMIN D3) 25 MCG (1000 UNIT) tablet Take 4,000 Units by mouth daily.   Yes [provider]  diclofenac Sodium (VOLTAREN) 1 % GEL Apply 4 g topically 4 (four) times daily.   Yes [provider]  escitalopram (LEXAPRO) 10 MG tablet Take 10 mg by mouth daily.   Yes [provider]  estradiol (ESTRACE) 0.1 MG/GM vaginal cream Place 1 Applicatorful vaginally at bedtime. 06/11/15  Yes McGowan, Carollee Herter A, PA-C  ferrous sulfate (SM IRON) 325 (65 FE) MG tablet Take 325 mg by mouth.   Yes [provider]  furosemide (LASIX) 20 MG tablet Take 20 mg by mouth 2 (two) times daily.   Yes [provider]  latanoprost (XALATAN) 0.005 % ophthalmic solution Place 1 drop into both eyes at bedtime.   Yes [provider]  MAGNESIUM OXIDE PO Take 500 mg by mouth daily.   Yes [provider]  omeprazole (PRILOSEC) 20 MG capsule Take 20 mg by mouth daily.   Yes [provider]  polyethylene glycol (MIRALAX) packet Take 17 g by mouth daily.  05/03/12  Yes [provider]  potassium chloride (K-DUR,KLOR-CON) 10 MEQ tablet Take 10 mEq by mouth 2 (two) times daily.   Yes [provider]  predniSONE (DELTASONE) 10 MG tablet Take 5 mg by mouth daily with breakfast.  01/18/15  Yes [provider]  senna (SENOKOT) 8.6 MG TABS tablet Take 2 tablets by mouth at bedtime.   Yes [provider]  traMADol (ULTRAM) 50 MG tablet Take 50 mg by mouth 2 (two) times daily.   Yes [provider]  vitamin B-12 (CYANOCOBALAMIN) 250 MCG tablet Take 250 mcg by mouth every 14 (fourteen) days.    Yes [provider]  valsartan-hydrochlorothiazide (DIOVAN-HCT) 320-25 MG per tablet Take 1 tablet by mouth daily.    [provider]    Physical Exam: Vitals:    06/13/20 0700 06/13/20 0800 06/13/20 1155 06/13/20 1541  BP: (!) 141/67 (!) 146/78 139/72 136/60  Pulse: 89 86 86 82  Resp: 17 16 17 17   Temp:  98.1 F (36.7 C) 99.4 F (37.4 C) 99.3 F (37.4 C)  TempSrc:  Oral Oral Oral  SpO2: 97% 98% 100% 100%  Weight:      Height:       General: Not in acute distress HEENT:       Eyes: PERRL, EOMI, no scleral icterus. Left eye is swelling, with little bleeding       ENT: No discharge from the ears and nose, no pharynx injection, no tonsillar enlargement.        Neck: No JVD, no bruit, no mass felt. Heme: No neck lymph node enlargement. Cardiac: S1/S2, RRR, No murmurs, No gallops or rubs. Respiratory: No rales, wheezing, rhonchi or rubs. GI:  Soft, nondistended, nontender, no rebound pain, no organomegaly, BS present. GU: No hematuria Ext: No pitting leg edema bilaterally. 1+DP/PT pulse bilaterally. Musculoskeletal: has tenderness in left hip and left upper thigh. Skin: No rashes.  Neuro: Patient is confused, knows her own name, not orientated to time and place, cranial nerves II-XII grossly intact, moves all extremities. Psych: Patient is not psychotic, no suicidal or hemocidal ideation.  Labs on Admission: I have personally reviewed following labs and imaging studies  CBC: Recent Labs  Lab 06/13/20 0453  WBC 6.0  NEUTROABS 3.3  HGB 12.4  HCT 38.9  MCV 100.5*  PLT 165   Basic Metabolic Panel: Recent Labs  Lab 06/13/20 0453  NA 141  K 3.7  CL 99  CO2 33*  GLUCOSE 102*  BUN 12  CREATININE 0.45  CALCIUM 9.8   GFR: Estimated Creatinine Clearance: 52.8 mL/min (by C-G formula based on SCr of 0.45 mg/dL). Liver Function Tests: Recent Labs  Lab 06/13/20 0453  AST 19  ALT 14  ALKPHOS 51  BILITOT 1.0  PROT 7.3  ALBUMIN 4.1   No results for input(s): LIPASE, AMYLASE in the last 168 hours. No results for input(s): AMMONIA in the last 168 hours. Coagulation Profile: Recent Labs  Lab 06/13/20 0453  INR 1.0   Cardiac  Enzymes: No results for input(s): CKTOTAL, CKMB, CKMBINDEX, TROPONINI in the last 168 hours. BNP (last 3 results) No results for input(s): PROBNP in the last 8760 hours. HbA1C: No results for input(s): HGBA1C in the last 72 hours. CBG: No results for input(s): GLUCAP in the last 168 hours. Lipid Profile: No results for input(s): CHOL, HDL, LDLCALC, TRIG, CHOLHDL, LDLDIRECT in the last 72 hours. Thyroid Function Tests: No results for input(s): TSH, T4TOTAL, FREET4, T3FREE, THYROIDAB in the last 72 hours. Anemia Panel: No results for input(s): VITAMINB12, FOLATE, FERRITIN, TIBC, IRON, RETICCTPCT in the last 72 hours. Urine analysis: No results found for: COLORURINE, APPEARANCEUR, LABSPEC, PHURINE, GLUCOSEU, HGBUR, BILIRUBINUR, KETONESUR, PROTEINUR, UROBILINOGEN, NITRITE, LEUKOCYTESUR Sepsis Labs: @LABRCNTIP (procalcitonin:4,lacticidven:4) ) Recent Results (from the past 240 hour(s))  Resp Panel by RT-PCR (Flu A&B, Covid) Nasopharyngeal Swab     Status: None   Collection Time: 06/13/20  4:53 AM   Specimen: Nasopharyngeal Swab; Nasopharyngeal(NP) swabs in vial transport medium  Result Value Ref Range Status   SARS Coronavirus 2 by RT PCR NEGATIVE NEGATIVE Final    Comment: (NOTE) SARS-CoV-2 target nucleic acids are NOT DETECTED.  The SARS-CoV-2 RNA is generally detectable in upper respiratory specimens during the acute phase of infection. The lowest concentration of SARS-CoV-2 viral copies this assay can detect is 138 copies/mL. A negative result does not preclude SARS-Cov-2 infection and should not be used as the sole basis for treatment or other patient management decisions. A negative result may occur with  improper specimen collection/handling, submission of specimen other than nasopharyngeal swab, presence of viral mutation(s) within the areas targeted by this assay, and inadequate number of viral copies(<138 copies/mL). A negative result must be combined with clinical  observations, patient history, and epidemiological information. The expected result is Negative.  Fact Sheet for Patients:  BloggerCourse.com  Fact Sheet for Healthcare Providers:  SeriousBroker.it  This test is no t yet approved or cleared by the [place] FDA and  has been authorized for detection and/or diagnosis of SARS-CoV-2 by FDA under an Emergency Use Authorization (EUA). This EUA will remain  in effect (meaning this test can be used) for the duration of the COVID-19 declaration under Section 564(b)(1)  of the Act, 21 U.S.C.section 360bbb-3(b)(1), unless the authorization is terminated  or revoked sooner.       Influenza A by PCR NEGATIVE NEGATIVE Final   Influenza B by PCR NEGATIVE NEGATIVE Final    Comment: (NOTE) The Xpert Xpress SARS-CoV-2/FLU/RSV plus assay is intended as an aid in the diagnosis of influenza from Nasopharyngeal swab specimens and should not be used as a sole basis for treatment. Nasal washings and aspirates are unacceptable for Xpert Xpress SARS-CoV-2/FLU/RSV testing.  Fact Sheet for Patients: BloggerCourse.com  Fact Sheet for Healthcare Providers: SeriousBroker.it  This test is not yet approved or cleared by the [place] FDA and has been authorized for detection and/or diagnosis of SARS-CoV-2 by FDA under an Emergency Use Authorization (EUA). This EUA will remain in effect (meaning this test can be used) for the duration of the COVID-19 declaration under Section 564(b)(1) of the Act, 21 U.S.C. section 360bbb-3(b)(1), unless the authorization is terminated or revoked.  Performed at [facility], [address] [place], [place] [address]      Radiological Exams on Admission: DG Pelvis 1-2 Views  Result Date: 06/13/2020 CLINICAL DATA:  Pain after fall EXAM: PELVIS - 1-2 VIEW COMPARISON:  None. FINDINGS: Evaluation of the  hips is limited due to positioning, particularly on the right. No fractures are seen. IMPRESSION: Limited evaluation of the hips due to positioning. No convincing evidence of fracture. The pelvic bones are unremarkable. Electronically Signed   By: Gerome Sam III M.D   On: 06/13/2020 15:30   CT Head Wo Contrast  Result Date: 06/13/2020 CLINICAL DATA:  86 year old female with head, face and neck injury following fall. Initial encounter. EXAM: CT HEAD WITHOUT CONTRAST CT MAXILLOFACIAL WITHOUT CONTRAST CT CERVICAL SPINE WITHOUT CONTRAST TECHNIQUE: Multidetector CT imaging of the head, cervical spine, and maxillofacial structures were performed using the standard protocol without intravenous contrast. Multiplanar CT image reconstructions of the cervical spine and maxillofacial structures were also generated. COMPARISON:  None. FINDINGS: CT HEAD FINDINGS Brain: No evidence of acute infarction, hemorrhage, hydrocephalus, extra-axial collection or mass lesion/mass effect. Moderate periventricular white matter hypodensities likely represents chronic small-vessel white matter ischemic changes. Vascular: Carotid atherosclerotic calcifications are noted. Skull: No acute fracture. Other: Mild forehead soft tissue swelling noted. CT MAXILLOFACIAL FINDINGS Osseous: No acute fracture is identified. No subluxation or dislocation noted. Orbits: There is hemorrhage along the posterior aspect of the LEFT globe, at the junction of the optic nerve and globe. The globe retains its spherical shape and there is no gross intraglobal abnormality. LEFT proptosis and LEFT preseptal soft tissue swelling is also noted. The RIGHT orbit is unremarkable. There is no evidence of acute orbital fracture. Sinuses: A RIGHT mastoid effusion is noted without identifiable fracture. The middle and inner ears are clear bilaterally. The paranasal sinuses are clear. Soft tissues: Forehead soft tissue swelling is noted. CT CERVICAL SPINE FINDINGS  Alignment: Loss of the normal cervical lordosis is noted without subluxation. Skull base and vertebrae: No acute fracture. No primary bone lesion or focal pathologic process. Soft tissues and spinal canal: No prevertebral fluid or swelling. No visible canal hematoma. Disc levels: Moderate multilevel degenerative disc disease/spondylosis and facet arthropathy noted. Upper chest: No acute abnormality Other: None IMPRESSION: 1. Hemorrhage/injury at the junction of the LEFT optic nerve and LEFT globe. LEFT proptosis without evidence of gross intraglobal abnormality. LEFT preseptal soft tissue swelling without orbital fracture. 2. No evidence of acute intracranial abnormality. Moderate chronic small-vessel white matter ischemic changes. 3. No static  evidence of acute injury to the cervical spine. Moderate multilevel degenerative disc disease/spondylosis and facet arthropathy. 4. RIGHT mastoid effusion without identifiable mastoid fracture. Middle and inner ears are clear. Electronically Signed   By: Harmon Pier M.D.   On: 06/13/2020 06:08   CT Cervical Spine Wo Contrast  Result Date: 06/13/2020 CLINICAL DATA:  86 year old female with head, face and neck injury following fall. Initial encounter. EXAM: CT HEAD WITHOUT CONTRAST CT MAXILLOFACIAL WITHOUT CONTRAST CT CERVICAL SPINE WITHOUT CONTRAST TECHNIQUE: Multidetector CT imaging of the head, cervical spine, and maxillofacial structures were performed using the standard protocol without intravenous contrast. Multiplanar CT image reconstructions of the cervical spine and maxillofacial structures were also generated. COMPARISON:  None. FINDINGS: CT HEAD FINDINGS Brain: No evidence of acute infarction, hemorrhage, hydrocephalus, extra-axial collection or mass lesion/mass effect. Moderate periventricular white matter hypodensities likely represents chronic small-vessel white matter ischemic changes. Vascular: Carotid atherosclerotic calcifications are noted. Skull: No  acute fracture. Other: Mild forehead soft tissue swelling noted. CT MAXILLOFACIAL FINDINGS Osseous: No acute fracture is identified. No subluxation or dislocation noted. Orbits: There is hemorrhage along the posterior aspect of the LEFT globe, at the junction of the optic nerve and globe. The globe retains its spherical shape and there is no gross intraglobal abnormality. LEFT proptosis and LEFT preseptal soft tissue swelling is also noted. The RIGHT orbit is unremarkable. There is no evidence of acute orbital fracture. Sinuses: A RIGHT mastoid effusion is noted without identifiable fracture. The middle and inner ears are clear bilaterally. The paranasal sinuses are clear. Soft tissues: Forehead soft tissue swelling is noted. CT CERVICAL SPINE FINDINGS Alignment: Loss of the normal cervical lordosis is noted without subluxation. Skull base and vertebrae: No acute fracture. No primary bone lesion or focal pathologic process. Soft tissues and spinal canal: No prevertebral fluid or swelling. No visible canal hematoma. Disc levels: Moderate multilevel degenerative disc disease/spondylosis and facet arthropathy noted. Upper chest: No acute abnormality Other: None IMPRESSION: 1. Hemorrhage/injury at the junction of the LEFT optic nerve and LEFT globe. LEFT proptosis without evidence of gross intraglobal abnormality. LEFT preseptal soft tissue swelling without orbital fracture. 2. No evidence of acute intracranial abnormality. Moderate chronic small-vessel white matter ischemic changes. 3. No static evidence of acute injury to the cervical spine. Moderate multilevel degenerative disc disease/spondylosis and facet arthropathy. 4. RIGHT mastoid effusion without identifiable mastoid fracture. Middle and inner ears are clear. Electronically Signed   By: Harmon Pier M.D.   On: 06/13/2020 06:08   DG Chest Port 1 View  Result Date: 06/13/2020 CLINICAL DATA:  Fall and pain. EXAM: PORTABLE CHEST 1 VIEW COMPARISON:  12/18/2018  chest radiograph FINDINGS: This is a low volume and rotated study due to patient condition. Cardiomegaly with bilateral interstitial and hazy opacities are noted, but not significantly changed. Bibasilar atelectasis/scarring again identified. No pneumothorax or large pleural effusion noted. No acute bony abnormalities are identified. IMPRESSION: Unchanged appearance of the chest.  No acute abnormalities noted. Cardiomegaly with chronic appearing bilateral interstitial and hazy opacities. Electronically Signed   By: Harmon Pier M.D.   On: 06/13/2020 05:52   DG FEMUR MIN 2 VIEWS LEFT  Result Date: 06/13/2020 CLINICAL DATA:  Pain after fall. EXAM: LEFT FEMUR 2 VIEWS COMPARISON:  None. FINDINGS: Degenerative changes in the left knee. Vascular calcifications. No fracture in the femur. No evidence of dislocation. The lateral view is limited at the level the femoral head. IMPRESSION: No fractures or dislocations. Degenerative changes in the left knee. Vascular calcifications.  Electronically Signed   By: Gerome Sam III M.D   On: 06/13/2020 15:29   CT Maxillofacial WO CM  Result Date: 06/13/2020 CLINICAL DATA:  86 year old female with head, face and neck injury following fall. Initial encounter. EXAM: CT HEAD WITHOUT CONTRAST CT MAXILLOFACIAL WITHOUT CONTRAST CT CERVICAL SPINE WITHOUT CONTRAST TECHNIQUE: Multidetector CT imaging of the head, cervical spine, and maxillofacial structures were performed using the standard protocol without intravenous contrast. Multiplanar CT image reconstructions of the cervical spine and maxillofacial structures were also generated. COMPARISON:  None. FINDINGS: CT HEAD FINDINGS Brain: No evidence of acute infarction, hemorrhage, hydrocephalus, extra-axial collection or mass lesion/mass effect. Moderate periventricular white matter hypodensities likely represents chronic small-vessel white matter ischemic changes. Vascular: Carotid atherosclerotic calcifications are noted. Skull:  No acute fracture. Other: Mild forehead soft tissue swelling noted. CT MAXILLOFACIAL FINDINGS Osseous: No acute fracture is identified. No subluxation or dislocation noted. Orbits: There is hemorrhage along the posterior aspect of the LEFT globe, at the junction of the optic nerve and globe. The globe retains its spherical shape and there is no gross intraglobal abnormality. LEFT proptosis and LEFT preseptal soft tissue swelling is also noted. The RIGHT orbit is unremarkable. There is no evidence of acute orbital fracture. Sinuses: A RIGHT mastoid effusion is noted without identifiable fracture. The middle and inner ears are clear bilaterally. The paranasal sinuses are clear. Soft tissues: Forehead soft tissue swelling is noted. CT CERVICAL SPINE FINDINGS Alignment: Loss of the normal cervical lordosis is noted without subluxation. Skull base and vertebrae: No acute fracture. No primary bone lesion or focal pathologic process. Soft tissues and spinal canal: No prevertebral fluid or swelling. No visible canal hematoma. Disc levels: Moderate multilevel degenerative disc disease/spondylosis and facet arthropathy noted. Upper chest: No acute abnormality Other: None IMPRESSION: 1. Hemorrhage/injury at the junction of the LEFT optic nerve and LEFT globe. LEFT proptosis without evidence of gross intraglobal abnormality. LEFT preseptal soft tissue swelling without orbital fracture. 2. No evidence of acute intracranial abnormality. Moderate chronic small-vessel white matter ischemic changes. 3. No static evidence of acute injury to the cervical spine. Moderate multilevel degenerative disc disease/spondylosis and facet arthropathy. 4. RIGHT mastoid effusion without identifiable mastoid fracture. Middle and inner ears are clear. Electronically Signed   By: Harmon Pier M.D.   On: 06/13/2020 06:08     EKG: I have personally reviewed.  Sinus rhythm, QTC 352, early R wave progression, nonspecific T wave  change  Assessment/Plan Principal Problem:   Eye injury Active Problems:   CAD (coronary artery disease)   GERD (gastroesophageal reflux disease)   Hypertension   Pure hypercholesterolemia   Fall   Asthma   Depression   Acute metabolic encephalopathy   Iron deficiency anemia   Eye injury: CT scan showed hemorrhage/injury at the junction of the LEFT optic nerve and LEFT globe. Dr. Druscilla Brownie of ophthalmology is consulted.  - Patient is admitted to MedSurg bed as inpatient by accepting MD -Pain control: As needed Percocet, Tylenol -Follow-up ophthalmology's recommendation  CAD (coronary artery disease): No chest pain -Continue Lipitor -Hold aspirin due to eye bleeding  GERD (gastroesophageal reflux disease) -Protonix  Hypertension -IV hydralazine as needed -Amlodipine  Pure hypercholesterolemia -Lipitor  Fall: Patient complains of left upper thigh pain and left hip pain -Will need PT/OT in nursing home -Follow-up x-ray of left hip/pelvis/femur  Asthma: Stable -Bronchodilators  Depression -Continue home medications: BuSpar, Lexapro  Acute metabolic encephalopathy: Etiology is not clear.  Maybe due to delirium.  CT head is  negative for acute intracranial abnormalities. -Frequent neuro check -Follow-up urinalysis   iron deficiency anemia: Hemoglobin stable 12.4 -Continue home iron supplement  Prednisone use: Patient is taking prednisone 5 mg daily, not sure why patient is taking this medication.  Patient has history of CPPD and asthma. -Increase prednisone dose to 15 mg daily -Give Solu-Cortef 50 mg stress dose    DVT ppx: SCD Code Status: DNR per her daughter Family Communication:  Yes, patient's daughter by phone Disposition Plan:  Anticipate discharge back to previous environment Consults called:  Dr. Druscilla BrowniePorfilio of ophthalmology Admission status: Med-surg bed as inpt       Status is: Inpatient Remains inpatient appropriate because:Inpatient level of care  appropriate due to severity of illness.  Patient is a multiple comorbidities, now presents with fall and complicated left eye injury.  Patient also has altered mental status.  Her presentation is highly complicated.  Patient is at high risk of deteriorating given her old age.  Will need to be treated in the hospital for at least 2 days.   Dispo: The patient is from: SNF              Anticipated d/c is to: SNF              Anticipated d/c date is: 2 days              Patient currently is not medically stable to d/c.          Date of Service 06/13/2020    Lorretta HarpXilin Lakelynn Severtson [facility]   If 7PM-7AM, please contact night-coverage [email or web address] 06/13/2020, 5:07 PM

## 2020-06-13 NOTE — ED Triage Notes (Signed)
Pt presents to ER via ems from [facility].  Pt presented to ER w/blood dripping from left eye.  Pt's left eye is noticeably swollen and bloody.  Pt had mechanical fall, striking head on bed.  Pt A&O x2 at baseline.

## 2020-06-13 NOTE — ED Notes (Signed)
Patient paperwork from facility placed in envelope with patient label on it and placed in patient bin at MD station. Patient paperwork includes DNR form.

## 2020-06-13 NOTE — ED Notes (Signed)
Patient paperwork tubed to pharmacy per their request for [facility]. Pharmacy to return paperwork upon completion of requisition.

## 2020-06-13 NOTE — ED Notes (Signed)
RN spoke with patients daughter with patients permission. Daughter updated regarding treatment thus far as well as plan for admission. Daughter also informed of patient room placement. Daughter verbalized understanding of all discussed and denies further questions at this time.

## 2020-06-13 NOTE — Plan of Care (Signed)

## 2020-06-13 NOTE — ED Notes (Signed)
Patient paperwork and DNR form returned from pharmacy and placed back in patient bin at Physician's station with patient label on envelope.

## 2020-06-13 NOTE — ED Provider Notes (Signed)
[facility] Emergency Department Provider Note   ____________________________________________   First MD Initiated Contact with Patient 06/13/20 [phone]     (approximate)  I have reviewed the triage vital signs and the nursing notes.   HISTORY  Chief Complaint Fall  Level V caveat: Limited by altered mentation History obtained via SNF staff  HPI Annette Combs is a 86 y.o. female brought to the ED via EMS from [facility] status post fall with facial injury.  Per nursing staff, patient is usually alert and conversant.  Nursing staff noted patient was altered last evening.  Although nursing staff states a sitter was in the patient's room, patient fell from her bed, striking her left face.  Unable to recall events.  States "sore" from the fall. Rest of history is limited secondary to decreased LOC.     Past Medical History:  Diagnosis Date  . Acid reflux   . Anemia   . Anxiety   . Arthritis   . Asthma   . Atrophic vaginitis   . Depression   . Depression   . Hypertension   . Hypertension   . Incontinence   . Mitral valve disorder   . Nocturia   . Suicide attempt [facility])     Patient Active Problem List   Diagnosis Date Noted  . Fall 06/13/2020  . Anxiety 02/27/2016  . B12 deficiency 08/21/2015  . Moderate episode of recurrent major depressive disorder (HCC) 08/19/2015  . Urinary incontinence 02/25/2015  . Atrophic vaginitis 02/25/2015  . High risk medication use 01/28/2015  . Immunizations incomplete 12/23/2014  . Anemia 01/17/2014  . CAD (coronary artery disease) 01/17/2014  . GERD (gastroesophageal reflux disease) 01/17/2014  . Hypertension 01/17/2014  . 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 Noncompliance with medication regimen 01/17/2014  . Pure hypercholesterolemia 01/17/2014  . H/O calcium pyrophosphate deposition disease (CPPD) 12/16/2013  . Osteoarthritis 12/16/2013  . Osteoarthrosis 12/16/2013  . Personal history of other diseases of  the musculoskeletal system and connective tissue 12/16/2013    Past Surgical History:  Procedure Laterality Date  . ABDOMINAL HYSTERECTOMY  1984  . REPLACEMENT TOTAL KNEE  2006    Prior to Admission medications   Medication Sig Start Date End Date Taking? Authorizing Provider  acetaminophen (TYLENOL) 500 MG tablet Take 500 mg by mouth every 6 (six) hours as needed.    [provider]  amLODipine (NORVASC) 10 MG tablet Take 10 mg by mouth daily.    [provider]  aspirin 81 MG tablet Take 81 mg by mouth. 05/03/12   [provider]  atorvastatin (LIPITOR) 40 MG tablet Take 40 mg by mouth daily.    [provider]  docusate sodium (STOOL SOFTENER) 100 MG capsule Take 100 mg by mouth. 05/03/12   [provider]  estradiol (ESTRACE) 0.1 MG/GM vaginal cream Place 1 Applicatorful vaginally at bedtime. Patient not taking: Reported on 03/29/2016 06/11/15   Michiel Cowboy A, PA-C  ferrous sulfate (SM IRON) 325 (65 FE) MG tablet Take 325 mg by mouth.    [provider]  FLUoxetine (PROZAC) 40 MG capsule Take 40 mg by mouth. 02/11/17   [provider]  ibuprofen (ADVIL,MOTRIN) 200 MG tablet Take 200 mg by mouth.    [provider]  losartan (COZAAR) 100 MG tablet Take 100 mg by mouth daily.    [provider]  losartan-hydrochlorothiazide Mauri Reading) 100-25 MG per tablet Take by mouth. 05/03/12   [provider]  metoprolol Ria Bush)  50 MG tablet Take 50 mg by mouth. 05/03/12   [provider]  metoprolol succinate (TOPROL-XL) 50 MG 24 hr tablet Take 50 mg by mouth daily. Take with or immediately following a meal.    [provider]  niMODipine (NIMOTOP) 30 MG capsule Take 30 mg by mouth every 4 (four) hours.    [provider]  omeprazole (PRILOSEC) 20 MG capsule Take 20 mg by mouth daily.    [provider]  ondansetron (ZOFRAN ODT) 4 MG disintegrating tablet Take 4 mg by  mouth. 05/03/12   [provider]  oxybutynin (DITROPAN XL) 15 MG 24 hr tablet TAKE 1 TABLET (15 MG TOTAL) BY MOUTH AT BEDTIME. Patient not taking: Reported on 03/29/2017 03/11/16   Michiel Cowboy A, PA-C  oxybutynin (DITROPAN XL) 15 MG 24 hr tablet Take 1 tablet (15 mg total) by mouth at bedtime. Patient not taking: Reported on 03/29/2017 03/29/16   Michiel Cowboy A, PA-C  oxyCODONE-acetaminophen (PERCOCET/ROXICET) 5-325 MG per tablet Take 1 tablet by mouth every 4 (four) hours as needed for severe pain.    [provider]  polyethylene glycol [facility]) packet Take by mouth. 05/03/12   [provider]  potassium chloride (K-DUR,KLOR-CON) 10 MEQ tablet Take 10 mEq by mouth 2 (two) times daily.    [provider]  predniSONE (DELTASONE) 5 MG tablet Take 5 mg by mouth. 01/18/15   [provider]  rivaroxaban (XARELTO) 20 MG TABS tablet Take 20 mg by mouth.    [provider]  tamsulosin (FLOMAX) 0.4 MG CAPS capsule Take 1 capsule (0.4 mg total) by mouth daily. 03/29/17   Michiel Cowboy A, PA-C  valsartan-hydrochlorothiazide (DIOVAN-HCT) 320-25 MG per tablet Take 1 tablet by mouth daily.    [provider]  vitamin B-12 (CYANOCOBALAMIN) 1000 MCG tablet Take by mouth.    [provider]    Allergies Patient has no known allergies.  Family History  Problem Relation Age of Onset  . Kidney disease Neg Hx   . Bladder Cancer Neg Hx   . Kidney cancer Neg Hx     Social History Social History   Tobacco Use  . Smoking status: Never Smoker  . Smokeless tobacco: Never Used  Substance Use Topics  . Alcohol use: No    Alcohol/week: 0.0 standard drinks  . Drug use: No    Review of Systems  Constitutional: No fever/chills Eyes: No visual changes. ENT: Positive for facial injury.  No sore throat. Cardiovascular: Denies chest pain. Respiratory: Denies shortness of breath. Gastrointestinal: No abdominal pain.  No nausea, no  vomiting.  No diarrhea.  No constipation. Genitourinary: Negative for dysuria. Musculoskeletal: Negative for back pain. Skin: Negative for rash. Neurological: Positive for altered mentation prior to fall.  Negative for headaches, focal weakness or numbness.   ____________________________________________   PHYSICAL EXAM:  VITAL SIGNS: ED Triage Vitals  Enc Vitals Group     BP 06/13/20 0446 (!) 164/71     Pulse Rate 06/13/20 0446 94     Resp 06/13/20 0446 20     Temp 06/13/20 0446 97.9 F (36.6 C)     Temp Source 06/13/20 0446 Oral     SpO2 06/13/20 0446 98 %     Weight 06/13/20 0449 220 lb 7.4 oz (100 kg)     Height 06/13/20 0449  (1.575 m)     Head Circumference --      Peak Flow --      Pain Score --  Pain Loc --      Pain Edu? --      Excl. in GC? --     Constitutional: Alert and oriented.  Elderly appearing and in mild acute distress. Eyes: Large left subconjunctival hemorrhage.  Slight line of blood from medial aspect; patient laying on her right side.  Area was cleaned with gauze; no laceration noted.  Moderate left periorbital hematoma; ice applied to affected area.  PERRL. EOMI. Head: Atraumatic. Nose: Atraumatic. Mouth/Throat: Mucous membranes are mildly dry.  No dental malocclusion.   Neck: No stridor.  No cervical spine tenderness to palpation. Cardiovascular: Normal rate, regular rhythm. Grossly normal heart sounds.  Good peripheral circulation. Respiratory: Normal respiratory effort.  No retractions. Lungs CTAB. Gastrointestinal: Soft and nontender to light or deep palpation. No distention. No abdominal bruits. No CVA tenderness. Musculoskeletal: No lower extremity tenderness nor edema.  No joint effusions. Neurologic: Alert and oriented to person and place.  Mildly slurred speech and language. No gross focal neurologic deficits are appreciated.  MAEx4. Skin:  Skin is warm, dry and intact. No rash noted. Psychiatric: Mood and affect are normal. Speech  and behavior are normal.  ____________________________________________   LABS (all labs ordered are listed, but only abnormal results are displayed)  Labs Reviewed  CBC WITH DIFFERENTIAL/PLATELET - Abnormal; Notable for the following components:      Result Value   MCV 100.5 (*)    RDW 16.0 (*)    All other components within normal limits  COMPREHENSIVE METABOLIC PANEL - Abnormal; Notable for the following components:   CO2 33 (*)    Glucose, Bld 102 (*)    All other components within normal limits  RESP PANEL BY RT-PCR (FLU A&B, COVID) ARPGX2  LACTIC ACID, PLASMA  URINALYSIS, COMPLETE (UACMP) WITH MICROSCOPIC  TROPONIN I (HIGH SENSITIVITY)  TROPONIN I (HIGH SENSITIVITY)   ____________________________________________  EKG  ED ECG REPORT I, Lakeena Downie J, the attending physician, personally viewed and interpreted this ECG.   Date: 06/13/2020  EKG Time: 0456  Rate: 92  Rhythm: normal EKG, normal sinus rhythm  Axis: Normal  Intervals:none  ST&T Change: Nonspecific  ____________________________________________  RADIOLOGY I, Dea Bitting J, personally viewed and evaluated these images (plain radiographs) as part of my medical decision making, as well as reviewing the written report by the radiologist.  ED MD interpretation: CT head/C-spine without traumatic injury; CT maxillofacial demonstrates proptosis without globe rupture, hemorrhage/injury at junction of left optic nerve/globe.  Chest x-ray stable from prior  Official radiology report(s): CT Head Wo Contrast  Result Date: 06/13/2020 CLINICAL DATA:  86 year old female with head, face and neck injury following fall. Initial encounter. EXAM: CT HEAD WITHOUT CONTRAST CT MAXILLOFACIAL WITHOUT CONTRAST CT CERVICAL SPINE WITHOUT CONTRAST TECHNIQUE: Multidetector CT imaging of the head, cervical spine, and maxillofacial structures were performed using the standard protocol without intravenous contrast. Multiplanar CT image  reconstructions of the cervical spine and maxillofacial structures were also generated. COMPARISON:  None. FINDINGS: CT HEAD FINDINGS Brain: No evidence of acute infarction, hemorrhage, hydrocephalus, extra-axial collection or mass lesion/mass effect. Moderate periventricular white matter hypodensities likely represents chronic small-vessel white matter ischemic changes. Vascular: Carotid atherosclerotic calcifications are noted. Skull: No acute fracture. Other: Mild forehead soft tissue swelling noted. CT MAXILLOFACIAL FINDINGS Osseous: No acute fracture is identified. No subluxation or dislocation noted. Orbits: There is hemorrhage along the posterior aspect of the LEFT globe, at the junction of the optic nerve and globe. The globe retains its spherical shape and there is no gross  intraglobal abnormality. LEFT proptosis and LEFT preseptal soft tissue swelling is also noted. The RIGHT orbit is unremarkable. There is no evidence of acute orbital fracture. Sinuses: A RIGHT mastoid effusion is noted without identifiable fracture. The middle and inner ears are clear bilaterally. The paranasal sinuses are clear. Soft tissues: Forehead soft tissue swelling is noted. CT CERVICAL SPINE FINDINGS Alignment: Loss of the normal cervical lordosis is noted without subluxation. Skull base and vertebrae: No acute fracture. No primary bone lesion or focal pathologic process. Soft tissues and spinal canal: No prevertebral fluid or swelling. No visible canal hematoma. Disc levels: Moderate multilevel degenerative disc disease/spondylosis and facet arthropathy noted. Upper chest: No acute abnormality Other: None IMPRESSION: 1. Hemorrhage/injury at the junction of the LEFT optic nerve and LEFT globe. LEFT proptosis without evidence of gross intraglobal abnormality. LEFT preseptal soft tissue swelling without orbital fracture. 2. No evidence of acute intracranial abnormality. Moderate chronic small-vessel white matter ischemic changes.  3. No static evidence of acute injury to the cervical spine. Moderate multilevel degenerative disc disease/spondylosis and facet arthropathy. 4. RIGHT mastoid effusion without identifiable mastoid fracture. Middle and inner ears are clear. Electronically Signed   By: Harmon PierJeffrey  Hu M.D.   On: 06/13/2020 06:08   CT Cervical Spine Wo Contrast  Result Date: 06/13/2020 CLINICAL DATA:  86 year old female with head, face and neck injury following fall. Initial encounter. EXAM: CT HEAD WITHOUT CONTRAST CT MAXILLOFACIAL WITHOUT CONTRAST CT CERVICAL SPINE WITHOUT CONTRAST TECHNIQUE: Multidetector CT imaging of the head, cervical spine, and maxillofacial structures were performed using the standard protocol without intravenous contrast. Multiplanar CT image reconstructions of the cervical spine and maxillofacial structures were also generated. COMPARISON:  None. FINDINGS: CT HEAD FINDINGS Brain: No evidence of acute infarction, hemorrhage, hydrocephalus, extra-axial collection or mass lesion/mass effect. Moderate periventricular white matter hypodensities likely represents chronic small-vessel white matter ischemic changes. Vascular: Carotid atherosclerotic calcifications are noted. Skull: No acute fracture. Other: Mild forehead soft tissue swelling noted. CT MAXILLOFACIAL FINDINGS Osseous: No acute fracture is identified. No subluxation or dislocation noted. Orbits: There is hemorrhage along the posterior aspect of the LEFT globe, at the junction of the optic nerve and globe. The globe retains its spherical shape and there is no gross intraglobal abnormality. LEFT proptosis and LEFT preseptal soft tissue swelling is also noted. The RIGHT orbit is unremarkable. There is no evidence of acute orbital fracture. Sinuses: A RIGHT mastoid effusion is noted without identifiable fracture. The middle and inner ears are clear bilaterally. The paranasal sinuses are clear. Soft tissues: Forehead soft tissue swelling is noted. CT  CERVICAL SPINE FINDINGS Alignment: Loss of the normal cervical lordosis is noted without subluxation. Skull base and vertebrae: No acute fracture. No primary bone lesion or focal pathologic process. Soft tissues and spinal canal: No prevertebral fluid or swelling. No visible canal hematoma. Disc levels: Moderate multilevel degenerative disc disease/spondylosis and facet arthropathy noted. Upper chest: No acute abnormality Other: None IMPRESSION: 1. Hemorrhage/injury at the junction of the LEFT optic nerve and LEFT globe. LEFT proptosis without evidence of gross intraglobal abnormality. LEFT preseptal soft tissue swelling without orbital fracture. 2. No evidence of acute intracranial abnormality. Moderate chronic small-vessel white matter ischemic changes. 3. No static evidence of acute injury to the cervical spine. Moderate multilevel degenerative disc disease/spondylosis and facet arthropathy. 4. RIGHT mastoid effusion without identifiable mastoid fracture. Middle and inner ears are clear. Electronically Signed   By: Harmon PierJeffrey  Hu M.D.   On: 06/13/2020 06:08   DG Chest Port 1  View  Result Date: 06/13/2020 CLINICAL DATA:  Fall and pain. EXAM: PORTABLE CHEST 1 VIEW COMPARISON:  12/18/2018 chest radiograph FINDINGS: This is a low volume and rotated study due to patient condition. Cardiomegaly with bilateral interstitial and hazy opacities are noted, but not significantly changed. Bibasilar atelectasis/scarring again identified. No pneumothorax or large pleural effusion noted. No acute bony abnormalities are identified. IMPRESSION: Unchanged appearance of the chest.  No acute abnormalities noted. Cardiomegaly with chronic appearing bilateral interstitial and hazy opacities. Electronically Signed   By: Harmon Pier M.D.   On: 06/13/2020 05:52   CT Maxillofacial WO CM  Result Date: 06/13/2020 CLINICAL DATA:  86 year old female with head, face and neck injury following fall. Initial encounter. EXAM: CT HEAD WITHOUT  CONTRAST CT MAXILLOFACIAL WITHOUT CONTRAST CT CERVICAL SPINE WITHOUT CONTRAST TECHNIQUE: Multidetector CT imaging of the head, cervical spine, and maxillofacial structures were performed using the standard protocol without intravenous contrast. Multiplanar CT image reconstructions of the cervical spine and maxillofacial structures were also generated. COMPARISON:  None. FINDINGS: CT HEAD FINDINGS Brain: No evidence of acute infarction, hemorrhage, hydrocephalus, extra-axial collection or mass lesion/mass effect. Moderate periventricular white matter hypodensities likely represents chronic small-vessel white matter ischemic changes. Vascular: Carotid atherosclerotic calcifications are noted. Skull: No acute fracture. Other: Mild forehead soft tissue swelling noted. CT MAXILLOFACIAL FINDINGS Osseous: No acute fracture is identified. No subluxation or dislocation noted. Orbits: There is hemorrhage along the posterior aspect of the LEFT globe, at the junction of the optic nerve and globe. The globe retains its spherical shape and there is no gross intraglobal abnormality. LEFT proptosis and LEFT preseptal soft tissue swelling is also noted. The RIGHT orbit is unremarkable. There is no evidence of acute orbital fracture. Sinuses: A RIGHT mastoid effusion is noted without identifiable fracture. The middle and inner ears are clear bilaterally. The paranasal sinuses are clear. Soft tissues: Forehead soft tissue swelling is noted. CT CERVICAL SPINE FINDINGS Alignment: Loss of the normal cervical lordosis is noted without subluxation. Skull base and vertebrae: No acute fracture. No primary bone lesion or focal pathologic process. Soft tissues and spinal canal: No prevertebral fluid or swelling. No visible canal hematoma. Disc levels: Moderate multilevel degenerative disc disease/spondylosis and facet arthropathy noted. Upper chest: No acute abnormality Other: None IMPRESSION: 1. Hemorrhage/injury at the junction of the LEFT  optic nerve and LEFT globe. LEFT proptosis without evidence of gross intraglobal abnormality. LEFT preseptal soft tissue swelling without orbital fracture. 2. No evidence of acute intracranial abnormality. Moderate chronic small-vessel white matter ischemic changes. 3. No static evidence of acute injury to the cervical spine. Moderate multilevel degenerative disc disease/spondylosis and facet arthropathy. 4. RIGHT mastoid effusion without identifiable mastoid fracture. Middle and inner ears are clear. Electronically Signed   By: Harmon Pier M.D.   On: 06/13/2020 06:08    ____________________________________________   PROCEDURES  Procedure(s) performed (including Critical Care):  .1-3 Lead EKG Interpretation Performed by: Irean Hong, MD Authorized by: Irean Hong, MD     Interpretation: normal     ECG rate:  92   ECG rate assessment: normal     Rhythm: sinus rhythm     Ectopy: none     Conduction: normal   Comments:     Patient placed on cardiac monitor to evaluate for arrhythmias     ____________________________________________   INITIAL IMPRESSION / ASSESSMENT AND PLAN / ED COURSE  As part of my medical decision making, I reviewed the following data within the electronic MEDICAL RECORD NUMBER  Nursing notes reviewed and incorporated, Labs reviewed, EKG interpreted, Old chart reviewed (office visits for rheumatology, family medicine and neurology), Radiograph reviewed and Notes from prior ED visits (visits for COVID-19 and weakness)     86 year old female presenting from SNF with altered mentation prior to fall with facial injury.  Differential diagnosis includes but is not limited to ICH, CVA, infectious, ACS, metabolic etiologies, etc.  Will obtain cardiac work-up, CT imaging of head/cervical spine/maxillofacial.  Unclear if patient is currently taking Xarelto - it appears on patient's records; however, it does not appear on her MAR. Unclear if fall was mechanical fall versus  syncope. Anticipate hospitalization for altered mentation..   Clinical Course as of Jun 13 658  Wynelle Link Jun 13, 2020  5284 Discussed case with Dr. Druscilla Brownie from ophthalmology who will evaluate patient while she is in the hospital.  Hospitalist services have been contacted for admission.   [JS]    Clinical Course User Index [JS] Irean Hong, MD     ____________________________________________   FINAL CLINICAL IMPRESSION(S) / ED DIAGNOSES  Final diagnoses:  Fall, initial encounter  Subconjunctival bleed, left  Blunt injury, left eye, initial encounter  Altered mental status, unspecified altered mental status type     ED Discharge Orders    None      *Please note:  Annette Combs was evaluated in Emergency Department on 06/13/2020 for the symptoms described in the history of present illness. She was evaluated in the context of the global COVID-19 pandemic, which necessitated consideration that the patient might be at risk for infection with the SARS-CoV-2 virus that causes COVID-19. Institutional protocols and algorithms that pertain to the evaluation of patients at risk for COVID-19 are in a state of rapid change based on information released by regulatory bodies including the CDC and federal and state organizations. These policies and algorithms were followed during the patient's care in the ED.  Some ED evaluations and interventions may be delayed as a result of limited staffing during and the pandemic.*   Note:  This document was prepared using Dragon voice recognition software and may include unintentional dictation errors.   Irean Hong, MD 06/13/20 [phone]

## 2020-06-13 NOTE — ED Notes (Signed)
Called floor, RN unavailable at this time. Informed unit secretary that this RN on the way up with pt and will answer any questions the RN may have on the floor.

## 2020-06-13 NOTE — Consult Note (Signed)
Subjective: Pt fell out of her wheel chair striking her left face/ eye on the armrest. Minimal pain currently. Pt., nor family member present, can recall the patients last eye exam.  Objective: Vital signs in last 24 hours: Temp:  [97.9 F (36.6 C)-98.1 F (36.7 C)] 98.1 F (36.7 C) (11/21 0800) Pulse Rate:  [86-113] 86 (11/21 0800) Resp:  [14-20] 16 (11/21 0800) BP: (137-164)/(67-93) 146/78 (11/21 0800) SpO2:  [97 %-100 %] 98 % (11/21 0800) Weight:  [100 kg] 100 kg (11/21 0449)    EXAM:  VA:  hand motion OD                     J4 OS Pupils: NO APD OU            IOP: OD   OS  Motility difficult to assess due to limited ability to cooperate with commands.                  OS seems able to infraduct and supraduct without pain.  Anterior Segment : OD remarkable for dense cataract. Otherwise normal.                             OS  330 degrees of mildly elevated subconjunctival                                      Hemorrhage. Possible small conj laceration.                               Pseudophakia. Lens not displaced. No hemorrhage or iritis. Posterior Segment: OD  decreased view due to cataract. C/D 0.6                                          Retina intact , appears perfused                                OS  C/D 0.5   Retina and optic nerve intact. No tear/ heme/ commotio/ RD.    Recent Labs    06/13/20 0453  WBC 6.0  HGB 12.4  HCT 38.9  NA 141  K 3.7  CL 99  CO2 33*  BUN 12  CREATININE 0.45    Studies/Results: CT Head Wo Contrast  Result Date: 06/13/2020 CLINICAL DATA:  86 year old female with head, face and neck injury following fall. Initial encounter. EXAM: CT HEAD WITHOUT CONTRAST CT MAXILLOFACIAL WITHOUT CONTRAST CT CERVICAL SPINE WITHOUT CONTRAST TECHNIQUE: Multidetector CT imaging of the head, cervical spine, and maxillofacial structures were performed using the standard protocol without intravenous contrast. Multiplanar CT image  reconstructions of the cervical spine and maxillofacial structures were also generated. COMPARISON:  None. FINDINGS: CT HEAD FINDINGS Brain: No evidence of acute infarction, hemorrhage, hydrocephalus, extra-axial collection or mass lesion/mass effect. Moderate periventricular white matter hypodensities likely represents chronic small-vessel white matter ischemic changes. Vascular: Carotid atherosclerotic calcifications are noted. Skull: No acute fracture. Other: Mild forehead soft tissue swelling noted. CT MAXILLOFACIAL FINDINGS Osseous: No acute fracture is identified. No subluxation or dislocation noted. Orbits: There is hemorrhage along the posterior aspect  of the LEFT globe, at the junction of the optic nerve and globe. The globe retains its spherical shape and there is no gross intraglobal abnormality. LEFT proptosis and LEFT preseptal soft tissue swelling is also noted. The RIGHT orbit is unremarkable. There is no evidence of acute orbital fracture. Sinuses: A RIGHT mastoid effusion is noted without identifiable fracture. The middle and inner ears are clear bilaterally. The paranasal sinuses are clear. Soft tissues: Forehead soft tissue swelling is noted. CT CERVICAL SPINE FINDINGS Alignment: Loss of the normal cervical lordosis is noted without subluxation. Skull base and vertebrae: No acute fracture. No primary bone lesion or focal pathologic process. Soft tissues and spinal canal: No prevertebral fluid or swelling. No visible canal hematoma. Disc levels: Moderate multilevel degenerative disc disease/spondylosis and facet arthropathy noted. Upper chest: No acute abnormality Other: None IMPRESSION: 1. Hemorrhage/injury at the junction of the LEFT optic nerve and LEFT globe. LEFT proptosis without evidence of gross intraglobal abnormality. LEFT preseptal soft tissue swelling without orbital fracture. 2. No evidence of acute intracranial abnormality. Moderate chronic small-vessel white matter ischemic changes.  3. No static evidence of acute injury to the cervical spine. Moderate multilevel degenerative disc disease/spondylosis and facet arthropathy. 4. RIGHT mastoid effusion without identifiable mastoid fracture. Middle and inner ears are clear. Electronically Signed   By: Harmon PierJeffrey  Hu M.D.   On: 06/13/2020 06:08   CT Cervical Spine Wo Contrast  Result Date: 06/13/2020 CLINICAL DATA:  86 year old female with head, face and neck injury following fall. Initial encounter. EXAM: CT HEAD WITHOUT CONTRAST CT MAXILLOFACIAL WITHOUT CONTRAST CT CERVICAL SPINE WITHOUT CONTRAST TECHNIQUE: Multidetector CT imaging of the head, cervical spine, and maxillofacial structures were performed using the standard protocol without intravenous contrast. Multiplanar CT image reconstructions of the cervical spine and maxillofacial structures were also generated. COMPARISON:  None. FINDINGS: CT HEAD FINDINGS Brain: No evidence of acute infarction, hemorrhage, hydrocephalus, extra-axial collection or mass lesion/mass effect. Moderate periventricular white matter hypodensities likely represents chronic small-vessel white matter ischemic changes. Vascular: Carotid atherosclerotic calcifications are noted. Skull: No acute fracture. Other: Mild forehead soft tissue swelling noted. CT MAXILLOFACIAL FINDINGS Osseous: No acute fracture is identified. No subluxation or dislocation noted. Orbits: There is hemorrhage along the posterior aspect of the LEFT globe, at the junction of the optic nerve and globe. The globe retains its spherical shape and there is no gross intraglobal abnormality. LEFT proptosis and LEFT preseptal soft tissue swelling is also noted. The RIGHT orbit is unremarkable. There is no evidence of acute orbital fracture. Sinuses: A RIGHT mastoid effusion is noted without identifiable fracture. The middle and inner ears are clear bilaterally. The paranasal sinuses are clear. Soft tissues: Forehead soft tissue swelling is noted. CT  CERVICAL SPINE FINDINGS Alignment: Loss of the normal cervical lordosis is noted without subluxation. Skull base and vertebrae: No acute fracture. No primary bone lesion or focal pathologic process. Soft tissues and spinal canal: No prevertebral fluid or swelling. No visible canal hematoma. Disc levels: Moderate multilevel degenerative disc disease/spondylosis and facet arthropathy noted. Upper chest: No acute abnormality Other: None IMPRESSION: 1. Hemorrhage/injury at the junction of the LEFT optic nerve and LEFT globe. LEFT proptosis without evidence of gross intraglobal abnormality. LEFT preseptal soft tissue swelling without orbital fracture. 2. No evidence of acute intracranial abnormality. Moderate chronic small-vessel white matter ischemic changes. 3. No static evidence of acute injury to the cervical spine. Moderate multilevel degenerative disc disease/spondylosis and facet arthropathy. 4. RIGHT mastoid effusion without identifiable mastoid fracture. Middle and  inner ears are clear. Electronically Signed   By: Harmon Pier M.D.   On: 06/13/2020 06:08   DG Chest Port 1 View  Result Date: 06/13/2020 CLINICAL DATA:  Fall and pain. EXAM: PORTABLE CHEST 1 VIEW COMPARISON:  12/18/2018 chest radiograph FINDINGS: This is a low volume and rotated study due to patient condition. Cardiomegaly with bilateral interstitial and hazy opacities are noted, but not significantly changed. Bibasilar atelectasis/scarring again identified. No pneumothorax or large pleural effusion noted. No acute bony abnormalities are identified. IMPRESSION: Unchanged appearance of the chest.  No acute abnormalities noted. Cardiomegaly with chronic appearing bilateral interstitial and hazy opacities. Electronically Signed   By: Harmon Pier M.D.   On: 06/13/2020 05:52   CT Maxillofacial WO CM  Result Date: 06/13/2020 CLINICAL DATA:  86 year old female with head, face and neck injury following fall. Initial encounter. EXAM: CT HEAD WITHOUT  CONTRAST CT MAXILLOFACIAL WITHOUT CONTRAST CT CERVICAL SPINE WITHOUT CONTRAST TECHNIQUE: Multidetector CT imaging of the head, cervical spine, and maxillofacial structures were performed using the standard protocol without intravenous contrast. Multiplanar CT image reconstructions of the cervical spine and maxillofacial structures were also generated. COMPARISON:  None. FINDINGS: CT HEAD FINDINGS Brain: No evidence of acute infarction, hemorrhage, hydrocephalus, extra-axial collection or mass lesion/mass effect. Moderate periventricular white matter hypodensities likely represents chronic small-vessel white matter ischemic changes. Vascular: Carotid atherosclerotic calcifications are noted. Skull: No acute fracture. Other: Mild forehead soft tissue swelling noted. CT MAXILLOFACIAL FINDINGS Osseous: No acute fracture is identified. No subluxation or dislocation noted. Orbits: There is hemorrhage along the posterior aspect of the LEFT globe, at the junction of the optic nerve and globe. The globe retains its spherical shape and there is no gross intraglobal abnormality. LEFT proptosis and LEFT preseptal soft tissue swelling is also noted. The RIGHT orbit is unremarkable. There is no evidence of acute orbital fracture. Sinuses: A RIGHT mastoid effusion is noted without identifiable fracture. The middle and inner ears are clear bilaterally. The paranasal sinuses are clear. Soft tissues: Forehead soft tissue swelling is noted. CT CERVICAL SPINE FINDINGS Alignment: Loss of the normal cervical lordosis is noted without subluxation. Skull base and vertebrae: No acute fracture. No primary bone lesion or focal pathologic process. Soft tissues and spinal canal: No prevertebral fluid or swelling. No visible canal hematoma. Disc levels: Moderate multilevel degenerative disc disease/spondylosis and facet arthropathy noted. Upper chest: No acute abnormality Other: None IMPRESSION: 1. Hemorrhage/injury at the junction of the LEFT  optic nerve and LEFT globe. LEFT proptosis without evidence of gross intraglobal abnormality. LEFT preseptal soft tissue swelling without orbital fracture. 2. No evidence of acute intracranial abnormality. Moderate chronic small-vessel white matter ischemic changes. 3. No static evidence of acute injury to the cervical spine. Moderate multilevel degenerative disc disease/spondylosis and facet arthropathy. 4. RIGHT mastoid effusion without identifiable mastoid fracture. Middle and inner ears are clear. Electronically Signed   By: Harmon Pier M.D.   On: 06/13/2020 06:08     Assessment/Plan: I personally reviewed the Imaging and agree with the radiology report.  There is a small amount of orbital hemorrhage collected along the posterior globe. There is some proptosis and periorbital soft tissue swelling. Globe appears intact.    1) Visually significant cataract OD. May revisit this as an outpatient if desired.   2) OHTN OD. I would reassess this , also as an outpatient. May be artifactual do to pt. Squeezing lids on attempted tonometry.  3) Orbital hemorrhage and [facility] without compartment syndrome. Treat this with  cool compresses and artificial tears 3-4 times daily.  Follow up at the [facility] 1-2 weeks after discharge.   LOS: 0 days   Galen Manila 11/21/202111:[id number] AM

## 2020-06-14 ENCOUNTER — Inpatient Hospital Stay: Payer: Medicare Other

## 2020-06-14 DIAGNOSIS — N390 Urinary tract infection, site not specified: Secondary | ICD-10-CM | POA: Diagnosis not present

## 2020-06-14 DIAGNOSIS — W19XXXA Unspecified fall, initial encounter: Secondary | ICD-10-CM | POA: Diagnosis not present

## 2020-06-14 DIAGNOSIS — G9341 Metabolic encephalopathy: Secondary | ICD-10-CM | POA: Diagnosis not present

## 2020-06-14 DIAGNOSIS — S0592XA Unspecified injury of left eye and orbit, initial encounter: Secondary | ICD-10-CM | POA: Diagnosis not present

## 2020-06-14 LAB — CBC
HCT: 34.8 % — ABNORMAL LOW (ref 36.0–46.0)
Hemoglobin: 11.1 g/dL — ABNORMAL LOW (ref 12.0–15.0)
MCH: 32.6 pg (ref 26.0–34.0)
MCHC: 31.9 g/dL (ref 30.0–36.0)
MCV: 102.4 fL — ABNORMAL HIGH (ref 80.0–100.0)
Platelets: 144 10*3/uL — ABNORMAL LOW (ref 150–400)
RBC: 3.4 MIL/uL — ABNORMAL LOW (ref 3.87–5.11)
RDW: 16.4 % — ABNORMAL HIGH (ref 11.5–15.5)
WBC: 7.2 10*3/uL (ref 4.0–10.5)
nRBC: 0 % (ref 0.0–0.2)

## 2020-06-14 LAB — BASIC METABOLIC PANEL
Anion gap: 10 (ref 5–15)
BUN: 9 mg/dL (ref 8–23)
CO2: 34 mmol/L — ABNORMAL HIGH (ref 22–32)
Calcium: 9.3 mg/dL (ref 8.9–10.3)
Chloride: 101 mmol/L (ref 98–111)
Creatinine, Ser: 0.4 mg/dL — ABNORMAL LOW (ref 0.44–1.00)
GFR, Estimated: >60 mL/min (ref >60)
Glucose, Bld: 94 mg/dL (ref 70–99)
Potassium: 3.1 mmol/L — ABNORMAL LOW (ref 3.5–5.1)
Sodium: 145 mmol/L (ref 135–145)

## 2020-06-14 MED ORDER — ACETAMINOPHEN 325 MG PO TABS
650.0000 mg | ORAL_TABLET | Freq: Four times a day (QID) | ORAL | Status: DC | PRN
  Administered 2020-06-14 – 2020-06-15 (×2): 650 mg via ORAL
  Filled 2020-06-14 (×2): qty 2

## 2020-06-14 MED ORDER — POTASSIUM CHLORIDE CRYS ER 20 MEQ PO TBCR
40.0000 meq | EXTENDED_RELEASE_TABLET | Freq: Once | ORAL | Status: AC
  Administered 2020-06-14: 40 meq via ORAL
  Filled 2020-06-14: qty 2

## 2020-06-14 MED ORDER — POLYVINYL ALCOHOL 1.4 % OP SOLN
1.0000 [drp] | Freq: Four times a day (QID) | OPHTHALMIC | Status: DC
  Administered 2020-06-14 – 2020-06-16 (×9): 1 [drp] via OPHTHALMIC
  Filled 2020-06-14: qty 15

## 2020-06-14 MED ORDER — KETOROLAC TROMETHAMINE 15 MG/ML IJ SOLN
15.0000 mg | Freq: Four times a day (QID) | INTRAMUSCULAR | Status: DC
  Administered 2020-06-14 – 2020-06-15 (×5): 15 mg via INTRAVENOUS
  Filled 2020-06-14 (×5): qty 1

## 2020-06-14 MED ORDER — OXYCODONE HCL 5 MG PO TABS: 5.0000 mg | ORAL_TABLET | ORAL | Status: DC | PRN

## 2020-06-14 MED ORDER — SODIUM CHLORIDE 0.9 % IV SOLN
1.0000 g | INTRAVENOUS | Status: AC
  Administered 2020-06-14 – 2020-06-16 (×3): 1 g via INTRAVENOUS
  Filled 2020-06-14 (×2): qty 10
  Filled 2020-06-14: qty 1

## 2020-06-14 NOTE — Progress Notes (Signed)
PROGRESS NOTE    Annette Combs  GGY:[id number] DOB: 10-20-1928 DOA: 06/13/2020 PCP: Mickey Farber, MD  Brief Narrative:  86 y.o. female with medical history significant of hypertension, hyperlipidemia, asthma, GERD, depression, anxiety, anemia, urinary incontinence, CAD, CPPD, who presents with fall and left eye injury, and AMS  Per report, patient is usually alert and conversant, but became more confused last evening.  Although nursing staff states a sitter was in the patient's room, patient fell from her bed, striking her left face. Pt is unable to recall events. Pt injured her left eye with little bleeding from left eye.  Seen by ophthalmology consultation.  Noted to be small amount of orbital hemorrhage around posterior globe with associated proptosis and periorbital soft tissue swelling.  Recommended cool compress and artificial tears 4 times a day with follow-up in [facility] 1 to 2 weeks post discharge.  11/22: [name] exam is improving clinically.  She endorses pain in her right upper extremity.  Very tender to touch.   Assessment & Plan:   Principal Problem:   Eye injury Active Problems:   CAD (coronary artery disease)   GERD (gastroesophageal reflux disease)   Hypertension   Pure hypercholesterolemia   Fall   Asthma   Depression   Acute metabolic encephalopathy   Iron deficiency anemia  Eye injury  CT scan showed hemorrhage/injury at the junction of the LEFT optic nerve and LEFT globe.  Dr. Druscilla Brownie of ophthalmology is consulted. No acute intervention from ophthalmology standpoint Recommend cool compress and artificial  tears 3-4 times a day Plan: Artificial tears 4 times daily Cool compress 3 times daily As needed oral pain medications Outpatient follow-up with ophthalmology 1 to 2 weeks post discharge  Acute metabolic encephalopathy Etiology is not clear.  Maybe due to delirium.  CT head is negative for acute intracranial abnormalities. Baseline  level of mentation is unclear Urinalysis indicative of infection -Frequent neuro check -Rocephin for UTI  Urinary tract infection Patient not obviously symptomatic however urine is grossly dirty Received Rocephin 1 g IV Will continue for 3-day total course Follow-up urinalysis and urine culture  Fall  Patient complains of left upper thigh pain and left hip pain -No fracture noted on x-ray of left hip/pelvis/femur -Patient complaining of severe right arm pain we will pursue imaging of the right forearm, wrist, elbow -Orthopedic consult should it be indicated   CAD (coronary artery disease)  No chest pain -Continue Lipitor -Hold aspirin due to  periorbital hematoma  GERD (gastroesophageal reflux disease) -Protonix  Hypertension -IV hydralazine as needed -Amlodipine  Pure hypercholesterolemia -Lipitor   Asthma  Stable -Bronchodilators  Depression -Continue home medications: BuSpar, Lexapro   iron deficiency anemia  Hemoglobin stable 12.4 -Continue home iron supplement  Prednisone use  Patient is taking prednisone 5 mg daily, not sure why patient is taking this medication.  Patient has history of CPPD and asthma. -Status post leg Cortef 50 mg x 1 -Can resume prednisone 5 mg daily    DVT prophylaxis: SCD Code Status: DNR Family Communication: Daughter Pandora via phone [phone] on 06/14/2020 Disposition Plan: Status is: Inpatient  Remains inpatient appropriate because:IV treatments appropriate due to intensity of illness or inability to take PO and Inpatient level of care appropriate due to severity of illness   Dispo: The patient is from: SNF              Anticipated d/c is to: SNF  Anticipated d/c date is: 1 day              Patient currently is not medically stable to d/c.  Possible discharge tomorrow if patient remains hemodynamically stable and imaging survey negative for fracture   Consultants:   None  Procedures:    None  Antimicrobials:   Ceftriaxone   Subjective: Patient seen and examined.  Endorses pain in the right arm.  States that periorbital swelling is improved.  Objective: Vitals:   06/14/20 0212 06/14/20 0507 06/14/20 0740 06/14/20 1122  BP: (!) 164/84 (!) 143/68 (!) 156/70 137/72  Pulse: 75 86 82 77  Resp:  16  17  Temp:  98.6 F (37 C) 98.6 F (37 C) 99.6 F (37.6 C)  TempSrc:      SpO2: 100% 100% 100% 100%  Weight:      Height:        Intake/Output Summary (Last 24 hours) at 06/14/2020 1343 Last data filed at 06/14/2020 7867 Gross per 24 hour  Intake 720 ml  Output 350 ml  Net 370 ml   Filed Weights   06/13/20 0449  Weight: 100 kg    Examination:  General exam: Mild distress due to pain Respiratory system: Poor respiratory effort.  Bibasilar crackles.  Normal work of breathing.  Room air Cardiovascular system: S1-S2, 2/6 systolic murmur, no pedal edema  gastrointestinal system: Obese, nontender, nondistended, normal bowel sounds Central nervous system: Alert and awake, oriented x2, no focal deficits Extremities: Decreased power and range of motion bilateral upper and lower extremities Skin: No rashes, lesions or ulcers Psychiatry: Judgement and insight appear impaired. Mood & affect flattened.     Data Reviewed: I have personally reviewed following labs and imaging studies  CBC: Recent Labs  Lab 06/13/20 0453 06/14/20 0216  WBC 6.0 7.2  NEUTROABS 3.3  --   HGB 12.4 11.1*  HCT 38.9 34.8*  MCV 100.5* 102.4*  PLT 165 144*   Basic Metabolic Panel: Recent Labs  Lab 06/13/20 0453 06/14/20 0216  NA 141 145  K 3.7 3.1*  CL 99 101  CO2 33* 34*  GLUCOSE 102* 94  BUN 12 9  CREATININE 0.45 0.40*  CALCIUM 9.8 9.3   GFR: Estimated Creatinine Clearance: 52.8 mL/min (A) (by C-G formula based on SCr of 0.4 mg/dL (L)). Liver Function Tests: Recent Labs  Lab 06/13/20 0453  AST 19  ALT 14  ALKPHOS 51  BILITOT 1.0  PROT 7.3  ALBUMIN 4.1   No  results for input(s): LIPASE, AMYLASE in the last 168 hours. No results for input(s): AMMONIA in the last 168 hours. Coagulation Profile: Recent Labs  Lab 06/13/20 0453  INR 1.0   Cardiac Enzymes: No results for input(s): CKTOTAL, CKMB, CKMBINDEX, TROPONINI in the last 168 hours. BNP (last 3 results) No results for input(s): PROBNP in the last 8760 hours. HbA1C: No results for input(s): HGBA1C in the last 72 hours. CBG: No results for input(s): GLUCAP in the last 168 hours. Lipid Profile: No results for input(s): CHOL, HDL, LDLCALC, TRIG, CHOLHDL, LDLDIRECT in the last 72 hours. Thyroid Function Tests: No results for input(s): TSH, T4TOTAL, FREET4, T3FREE, THYROIDAB in the last 72 hours. Anemia Panel: No results for input(s): VITAMINB12, FOLATE, FERRITIN, TIBC, IRON, RETICCTPCT in the last 72 hours. Sepsis Labs: Recent Labs  Lab 06/13/20 0459  LATICACIDVEN 1.4    Recent Results (from the past 240 hour(s))  Resp Panel by RT-PCR (Flu A&B, Covid) Nasopharyngeal Swab     Status:  None   Collection Time: 06/13/20  4:53 AM   Specimen: Nasopharyngeal Swab; Nasopharyngeal(NP) swabs in vial transport medium  Result Value Ref Range Status   SARS Coronavirus 2 by RT PCR NEGATIVE NEGATIVE Final    Comment: (NOTE) SARS-CoV-2 target nucleic acids are NOT DETECTED.  The SARS-CoV-2 RNA is generally detectable in upper respiratory specimens during the acute phase of infection. The lowest concentration of SARS-CoV-2 viral copies this assay can detect is 138 copies/mL. A negative result does not preclude SARS-Cov-2 infection and should not be used as the sole basis for treatment or other patient management decisions. A negative result may occur with  improper specimen collection/handling, submission of specimen other than nasopharyngeal swab, presence of viral mutation(s) within the areas targeted by this assay, and inadequate number of viral copies(<138 copies/mL). A negative result must  be combined with clinical observations, patient history, and epidemiological information. The expected result is Negative.  Fact Sheet for Patients:  BloggerCourse.com  Fact Sheet for Healthcare Providers:  SeriousBroker.it  This test is no t yet approved or cleared by the [place] FDA and  has been authorized for detection and/or diagnosis of SARS-CoV-2 by FDA under an Emergency Use Authorization (EUA). This EUA will remain  in effect (meaning this test can be used) for the duration of the COVID-19 declaration under Section 564(b)(1) of the Act, 21 U.S.C.section 360bbb-3(b)(1), unless the authorization is terminated  or revoked sooner.       Influenza A by PCR NEGATIVE NEGATIVE Final   Influenza B by PCR NEGATIVE NEGATIVE Final    Comment: (NOTE) The Xpert Xpress SARS-CoV-2/FLU/RSV plus assay is intended as an aid in the diagnosis of influenza from Nasopharyngeal swab specimens and should not be used as a sole basis for treatment. Nasal washings and aspirates are unacceptable for Xpert Xpress SARS-CoV-2/FLU/RSV testing.  Fact Sheet for Patients: BloggerCourse.com  Fact Sheet for Healthcare Providers: SeriousBroker.it  This test is not yet approved or cleared by the [place] FDA and has been authorized for detection and/or diagnosis of SARS-CoV-2 by FDA under an Emergency Use Authorization (EUA). This EUA will remain in effect (meaning this test can be used) for the duration of the COVID-19 declaration under Section 564(b)(1) of the Act, 21 U.S.C. section 360bbb-3(b)(1), unless the authorization is terminated or revoked.  Performed at [facility], [address], [place], [place] [address]          Radiology Studies: DG Pelvis 1-2 Views  Result Date: 06/13/2020 CLINICAL DATA:  Pain after fall EXAM: PELVIS - 1-2 VIEW COMPARISON:  None. FINDINGS:  Evaluation of the hips is limited due to positioning, particularly on the right. No fractures are seen. IMPRESSION: Limited evaluation of the hips due to positioning. No convincing evidence of fracture. The pelvic bones are unremarkable. Electronically Signed   By: Gerome Sam III M.D   On: 06/13/2020 15:30   DG Forearm Right  Result Date: 06/14/2020 CLINICAL DATA:  86 year old female status post fall with pain. EXAM: RIGHT FOREARM - 2 VIEW COMPARISON:  None. FINDINGS: Incidental IV access at both the right wrist and antecubital fossa. Bone mineralization is within normal limits for age. No evidence of elbow joint effusion. No acute right radius or ulna fracture identified. Degenerative changes at the wrist. Alignment appears maintained at the wrist and elbow. IMPRESSION: No acute fracture or dislocation identified about the right forearm. Electronically Signed   By: Odessa Fleming M.D.   On: 06/14/2020 10:54   CT Head Wo  Contrast  Result Date: 06/13/2020 CLINICAL DATA:  86 year old female with head, face and neck injury following fall. Initial encounter. EXAM: CT HEAD WITHOUT CONTRAST CT MAXILLOFACIAL WITHOUT CONTRAST CT CERVICAL SPINE WITHOUT CONTRAST TECHNIQUE: Multidetector CT imaging of the head, cervical spine, and maxillofacial structures were performed using the standard protocol without intravenous contrast. Multiplanar CT image reconstructions of the cervical spine and maxillofacial structures were also generated. COMPARISON:  None. FINDINGS: CT HEAD FINDINGS Brain: No evidence of acute infarction, hemorrhage, hydrocephalus, extra-axial collection or mass lesion/mass effect. Moderate periventricular white matter hypodensities likely represents chronic small-vessel white matter ischemic changes. Vascular: Carotid atherosclerotic calcifications are noted. Skull: No acute fracture. Other: Mild forehead soft tissue swelling noted. CT MAXILLOFACIAL FINDINGS Osseous: No acute fracture is identified. No  subluxation or dislocation noted. Orbits: There is hemorrhage along the posterior aspect of the LEFT globe, at the junction of the optic nerve and globe. The globe retains its spherical shape and there is no gross intraglobal abnormality. LEFT proptosis and LEFT preseptal soft tissue swelling is also noted. The RIGHT orbit is unremarkable. There is no evidence of acute orbital fracture. Sinuses: A RIGHT mastoid effusion is noted without identifiable fracture. The middle and inner ears are clear bilaterally. The paranasal sinuses are clear. Soft tissues: Forehead soft tissue swelling is noted. CT CERVICAL SPINE FINDINGS Alignment: Loss of the normal cervical lordosis is noted without subluxation. Skull base and vertebrae: No acute fracture. No primary bone lesion or focal pathologic process. Soft tissues and spinal canal: No prevertebral fluid or swelling. No visible canal hematoma. Disc levels: Moderate multilevel degenerative disc disease/spondylosis and facet arthropathy noted. Upper chest: No acute abnormality Other: None IMPRESSION: 1. Hemorrhage/injury at the junction of the LEFT optic nerve and LEFT globe. LEFT proptosis without evidence of gross intraglobal abnormality. LEFT preseptal soft tissue swelling without orbital fracture. 2. No evidence of acute intracranial abnormality. Moderate chronic small-vessel white matter ischemic changes. 3. No static evidence of acute injury to the cervical spine. Moderate multilevel degenerative disc disease/spondylosis and facet arthropathy. 4. RIGHT mastoid effusion without identifiable mastoid fracture. Middle and inner ears are clear. Electronically Signed   By: Harmon Pier M.D.   On: 06/13/2020 06:08   CT Cervical Spine Wo Contrast  Result Date: 06/13/2020 CLINICAL DATA:  86 year old female with head, face and neck injury following fall. Initial encounter. EXAM: CT HEAD WITHOUT CONTRAST CT MAXILLOFACIAL WITHOUT CONTRAST CT CERVICAL SPINE WITHOUT CONTRAST  TECHNIQUE: Multidetector CT imaging of the head, cervical spine, and maxillofacial structures were performed using the standard protocol without intravenous contrast. Multiplanar CT image reconstructions of the cervical spine and maxillofacial structures were also generated. COMPARISON:  None. FINDINGS: CT HEAD FINDINGS Brain: No evidence of acute infarction, hemorrhage, hydrocephalus, extra-axial collection or mass lesion/mass effect. Moderate periventricular white matter hypodensities likely represents chronic small-vessel white matter ischemic changes. Vascular: Carotid atherosclerotic calcifications are noted. Skull: No acute fracture. Other: Mild forehead soft tissue swelling noted. CT MAXILLOFACIAL FINDINGS Osseous: No acute fracture is identified. No subluxation or dislocation noted. Orbits: There is hemorrhage along the posterior aspect of the LEFT globe, at the junction of the optic nerve and globe. The globe retains its spherical shape and there is no gross intraglobal abnormality. LEFT proptosis and LEFT preseptal soft tissue swelling is also noted. The RIGHT orbit is unremarkable. There is no evidence of acute orbital fracture. Sinuses: A RIGHT mastoid effusion is noted without identifiable fracture. The middle and inner ears are clear bilaterally. The paranasal sinuses are clear. Soft  tissues: Forehead soft tissue swelling is noted. CT CERVICAL SPINE FINDINGS Alignment: Loss of the normal cervical lordosis is noted without subluxation. Skull base and vertebrae: No acute fracture. No primary bone lesion or focal pathologic process. Soft tissues and spinal canal: No prevertebral fluid or swelling. No visible canal hematoma. Disc levels: Moderate multilevel degenerative disc disease/spondylosis and facet arthropathy noted. Upper chest: No acute abnormality Other: None IMPRESSION: 1. Hemorrhage/injury at the junction of the LEFT optic nerve and LEFT globe. LEFT proptosis without evidence of gross  intraglobal abnormality. LEFT preseptal soft tissue swelling without orbital fracture. 2. No evidence of acute intracranial abnormality. Moderate chronic small-vessel white matter ischemic changes. 3. No static evidence of acute injury to the cervical spine. Moderate multilevel degenerative disc disease/spondylosis and facet arthropathy. 4. RIGHT mastoid effusion without identifiable mastoid fracture. Middle and inner ears are clear. Electronically Signed   By: Harmon Pier M.D.   On: 06/13/2020 06:08   DG Chest Port 1 View  Result Date: 06/13/2020 CLINICAL DATA:  Fall and pain. EXAM: PORTABLE CHEST 1 VIEW COMPARISON:  12/18/2018 chest radiograph FINDINGS: This is a low volume and rotated study due to patient condition. Cardiomegaly with bilateral interstitial and hazy opacities are noted, but not significantly changed. Bibasilar atelectasis/scarring again identified. No pneumothorax or large pleural effusion noted. No acute bony abnormalities are identified. IMPRESSION: Unchanged appearance of the chest.  No acute abnormalities noted. Cardiomegaly with chronic appearing bilateral interstitial and hazy opacities. Electronically Signed   By: Harmon Pier M.D.   On: 06/13/2020 05:52   DG FEMUR MIN 2 VIEWS LEFT  Result Date: 06/13/2020 CLINICAL DATA:  Pain after fall. EXAM: LEFT FEMUR 2 VIEWS COMPARISON:  None. FINDINGS: Degenerative changes in the left knee. Vascular calcifications. No fracture in the femur. No evidence of dislocation. The lateral view is limited at the level the femoral head. IMPRESSION: No fractures or dislocations. Degenerative changes in the left knee. Vascular calcifications. Electronically Signed   By: Gerome Sam III M.D   On: 06/13/2020 15:29   CT Maxillofacial WO CM  Result Date: 06/13/2020 CLINICAL DATA:  86 year old female with head, face and neck injury following fall. Initial encounter. EXAM: CT HEAD WITHOUT CONTRAST CT MAXILLOFACIAL WITHOUT CONTRAST CT CERVICAL SPINE  WITHOUT CONTRAST TECHNIQUE: Multidetector CT imaging of the head, cervical spine, and maxillofacial structures were performed using the standard protocol without intravenous contrast. Multiplanar CT image reconstructions of the cervical spine and maxillofacial structures were also generated. COMPARISON:  None. FINDINGS: CT HEAD FINDINGS Brain: No evidence of acute infarction, hemorrhage, hydrocephalus, extra-axial collection or mass lesion/mass effect. Moderate periventricular white matter hypodensities likely represents chronic small-vessel white matter ischemic changes. Vascular: Carotid atherosclerotic calcifications are noted. Skull: No acute fracture. Other: Mild forehead soft tissue swelling noted. CT MAXILLOFACIAL FINDINGS Osseous: No acute fracture is identified. No subluxation or dislocation noted. Orbits: There is hemorrhage along the posterior aspect of the LEFT globe, at the junction of the optic nerve and globe. The globe retains its spherical shape and there is no gross intraglobal abnormality. LEFT proptosis and LEFT preseptal soft tissue swelling is also noted. The RIGHT orbit is unremarkable. There is no evidence of acute orbital fracture. Sinuses: A RIGHT mastoid effusion is noted without identifiable fracture. The middle and inner ears are clear bilaterally. The paranasal sinuses are clear. Soft tissues: Forehead soft tissue swelling is noted. CT CERVICAL SPINE FINDINGS Alignment: Loss of the normal cervical lordosis is noted without subluxation. Skull base and vertebrae: No acute fracture. No  primary bone lesion or focal pathologic process. Soft tissues and spinal canal: No prevertebral fluid or swelling. No visible canal hematoma. Disc levels: Moderate multilevel degenerative disc disease/spondylosis and facet arthropathy noted. Upper chest: No acute abnormality Other: None IMPRESSION: 1. Hemorrhage/injury at the junction of the LEFT optic nerve and LEFT globe. LEFT proptosis without evidence of  gross intraglobal abnormality. LEFT preseptal soft tissue swelling without orbital fracture. 2. No evidence of acute intracranial abnormality. Moderate chronic small-vessel white matter ischemic changes. 3. No static evidence of acute injury to the cervical spine. Moderate multilevel degenerative disc disease/spondylosis and facet arthropathy. 4. RIGHT mastoid effusion without identifiable mastoid fracture. Middle and inner ears are clear. Electronically Signed   By: Harmon PierJeffrey  Hu M.D.   On: 06/13/2020 06:08        Scheduled Meds: . amLODipine  10 mg Oral Daily  . atorvastatin  40 mg Oral Daily  . busPIRone  10 mg Oral TID  . cholecalciferol  4,000 Units Oral Daily  . escitalopram  10 mg Oral Daily  . ferrous sulfate  325 mg Oral Q breakfast  . irbesartan  300 mg Oral Daily   And  . hydrochlorothiazide  25 mg Oral Daily  . ketorolac  15 mg Intravenous Q6H  . latanoprost  1 drop Both Eyes QHS  . magnesium oxide  400 mg Oral Daily  . pantoprazole  40 mg Oral Daily  . polyvinyl alcohol  1 drop Left Eye QID  . predniSONE  15 mg Oral Q breakfast  . senna  2 tablet Oral QHS   Continuous Infusions: . cefTRIAXone (ROCEPHIN)  IV 1 g (06/14/20 1000)     LOS: 1 day    Time spent: 25 minutes    Tresa MooreSudheer B Rihan Schueler, MD [facility] Pager [phone]  If 7PM-7AM, please contact night-coverage 06/14/2020, 1:43 PM

## 2020-06-14 NOTE — Plan of Care (Signed)
°  Problem: Education: °Goal: Knowledge of General Education information will improve °Description: Including pain rating scale, medication(s)/side effects and non-pharmacologic comfort measures °Outcome: Progressing °  °Problem: Health Behavior/Discharge Planning: °Goal: Ability to manage health-related needs will improve °Outcome: Not Progressing °  °Problem: Clinical Measurements: °Goal: Ability to maintain clinical measurements within normal limits will improve °Outcome: Progressing °Goal: Will remain free from infection °Outcome: Progressing °Goal: Diagnostic test results will improve °Outcome: Progressing °Goal: Respiratory complications will improve °Outcome: Progressing °Goal: Cardiovascular complication will be avoided °Outcome: Progressing °  °Problem: Activity: °Goal: Risk for activity intolerance will decrease °Outcome: Not Progressing °  °Problem: Nutrition: °Goal: Adequate nutrition will be maintained °Outcome: Not Progressing °  °Problem: Coping: °Goal: Level of anxiety will decrease °Outcome: Progressing °  °Problem: Elimination: °Goal: Will not experience complications related to bowel motility °Outcome: Progressing °Goal: Will not experience complications related to urinary retention °Outcome: Progressing °  °Problem: Pain Managment: °Goal: General experience of comfort will improve °Outcome: Progressing °  °Problem: Safety: °Goal: Ability to remain free from injury will improve °Outcome: Progressing °  °Problem: Skin Integrity: °Goal: Risk for impaired skin integrity will decrease °Outcome: Progressing °  °

## 2020-06-15 DIAGNOSIS — S0592XA Unspecified injury of left eye and orbit, initial encounter: Secondary | ICD-10-CM | POA: Diagnosis not present

## 2020-06-15 LAB — CBC
HCT: 33.4 % — ABNORMAL LOW (ref 36.0–46.0)
Hemoglobin: 10.7 g/dL — ABNORMAL LOW (ref 12.0–15.0)
MCH: 32.5 pg (ref 26.0–34.0)
MCHC: 32 g/dL (ref 30.0–36.0)
MCV: 101.5 fL — ABNORMAL HIGH (ref 80.0–100.0)
Platelets: 145 10*3/uL — ABNORMAL LOW (ref 150–400)
RBC: 3.29 MIL/uL — ABNORMAL LOW (ref 3.87–5.11)
RDW: 15.9 % — ABNORMAL HIGH (ref 11.5–15.5)
WBC: 7 10*3/uL (ref 4.0–10.5)
nRBC: 0 % (ref 0.0–0.2)

## 2020-06-15 LAB — GLUCOSE, CAPILLARY: Glucose-Capillary: 82 mg/dL (ref 70–99)

## 2020-06-15 NOTE — Evaluation (Signed)
Physical Therapy Evaluation Patient Details Name: Annette Combs MRN: [id number] DOB: [birth year] Today's Date: 06/15/2020   History of Present Illness  presented to ER secondary to fall from bed at LTC facility, noted with AMS, bleeding/injury to L eye; admitted for management of L eye injury (hemorrhage to L optic nerve, L globe).  Pelvis, L knee and R forearm imaging negative for acute injury.  Clinical Impression  Patient resting in bed upon arrival to session.  Awakens to voice, light touch; oriented to self and general location as hospital. Otherwise, generally confused with limited insight into current situation; difficulty with accurate recall of information.  L eye edematous, red/bloody in appearance with mild drainage noted (patient given warm cloth to wipe/cleanse).  Patient endorses pain in bilat knees, L > R, due to chronic arthritis; tolerating very minimal knee flexion with ROM assessment. Globally weak and deconditioned, demonstrating strength no greater than 3-/5 throughout bilat LEs.  Requiring act assist ROM for movement, all planes, in bilat hips and knees.  Currently requiring max/dep assist for scooting, repositioning in bed.  Tends to list towards R; dep for correction to midline, positioned with pillow under R shoulder to facilitate sustained midline positioning. Patient appears to be at baseline level of functional ability; no acute change in status/ability noted with this admission.  Baseline status confirmed with LTC facility (hoyer lift, WC level >18 months).  No acute PT needs identified at this time. Will complete initial order at this time; please re-consult should needs change.    Follow Up Recommendations No PT follow up (return to LTC at facility)    Equipment Recommendations       Recommendations for Other Services       Precautions / Restrictions Precautions Precautions: Fall Restrictions Weight Bearing Restrictions: No      Mobility  Bed  Mobility Overal bed mobility: Needs Assistance Bed Mobility: Rolling (pulling up, repositioning) Rolling: +2 for physical assistance;Total assist         General bed mobility comments: lists R, max/dep assist for repositioning to neutral.  Pillow placed behind R shoulder to facilitate maintenance of neutral position    Transfers                 General transfer comment: unsafe/unable; hoyer lift, dependant transfer at baseline  Ambulation/Gait             General Gait Details: unsafe/unable; hoyer lift, dependant transfer and non-ambulatory at baseline  Stairs            Wheelchair Mobility    Modified Rankin (Stroke Patients Only)       Balance                                             Pertinent Vitals/Pain Pain Assessment: Faces Faces Pain Scale: Hurts little more Pain Location: L knee Pain Descriptors / Indicators: Aching;Grimacing;Guarding Pain Intervention(s): Limited activity within patient's tolerance;Monitored during session;Repositioned    Home Living Family/patient expects to be discharged to:: Skilled nursing facility                 Additional Comments: Patient is LTC resident of [facility] since Feb 2019    Prior Function Level of Independence: Needs assistance   Gait / Transfers Assistance Needed: Daughter reports pt does not ambulate at baseline and pt reports use of hoyer lift to transfer into  wheelchair/recliner  ADL's / Homemaking Assistance Needed: SNF assisted with all ADLs including bathing/dressing. They also helped with feeding due to UE tremors  Comments: Patient dependant care at baseline; hoyer lift for transfers, WC level as primary mobility (for >18 months).  Staff assists with bathing, dressing at bed-level, dep. Patient is able to indep feed self.     Hand Dominance   Dominant Hand: Right    Extremity/Trunk Assessment   Upper Extremity Assessment Upper Extremity Assessment: Generalized  weakness (grossly 3-/5 throughout; no focal weakness appreciated)    Lower Extremity Assessment Lower Extremity Assessment: Generalized weakness (grossly 3-/5 throughout; no focal weakness, limited tolerance of knee flexion (L > R) due to baseline arthritis per patient.  Bilat ankle DF to neutral)       Communication   Communication: No difficulties  Cognition Arousal/Alertness: Awake/alert Behavior During Therapy: WFL for tasks assessed/performed Overall Cognitive Status: Within Functional Limits for tasks assessed                                 General Comments: Oriented to self and general location [facility]").  Generally confused to situation and events of current admission      General Comments      Exercises     Assessment/Plan    PT Assessment Patent does not need any further PT services  PT Problem List         PT Treatment Interventions      PT Goals (Current goals can be found in the Care Plan section)  Acute Rehab PT Goals Patient Stated Goal: none stated PT Goal Formulation: Patient unable to participate in goal setting Time For Goal Achievement: 06/15/20    Frequency     Barriers to discharge        Co-evaluation               AM-PAC PT "6 Clicks" Mobility  Outcome Measure Help needed turning from your back to your side while in a flat bed without using bedrails?: A Lot Help needed moving from lying on your back to sitting on the side of a flat bed without using bedrails?: Total Help needed moving to and from a bed to a chair (including a wheelchair)?: Total Help needed standing up from a chair using your arms (e.g., wheelchair or bedside chair)?: Total Help needed to walk in hospital room?: Total Help needed climbing 3-5 steps with a railing? : Total 6 Click Score: 7    End of Session   Activity Tolerance: Patient limited by pain Patient left: in bed;with bed alarm set;with call bell/phone within reach   PT  Visit Diagnosis: Muscle weakness (generalized) (M62.81)    Time: 7681-1572 PT Time Calculation (min) (ACUTE ONLY): 17 min   Charges:   PT Evaluation $PT Eval Moderate Complexity: 1 Mod         Larosa Rhines H. Manson Passey, PT, DPT, NCS 06/15/20, 3:41 PM [phone]

## 2020-06-15 NOTE — Evaluation (Signed)
Occupational Therapy Evaluation Patient Details Name: Annette Combs MRN: [id number] DOB: 03/13/1929 Today's Date: 06/15/2020    History of Present Illness 86 yo Female presents to hospital after suffering a fall at her nursing home. She was sitting in the wheelchair and was reaching for something and fell. She presents with injury to left eye and soreness in right UE and left thigh. X-rays were negative for fracture. They are waiting on orthopedic consult. Past medical history significant of hypertension, hyperlipidemia, asthma, GERD, depression, anxiety, anemia, urinary incontinence, CAD, CPPD, who presents with fall and left eye injury, and AMS.   Clinical Impression   Patient presenting with decreased I in self care, balance, functional mobility/transfer, endurance, and safety awareness. Pt daughter told RN staff that pt lives in [place] and does not ambulate and needs assistance for self care tasks.Patient also reporting bed level ADLs with her performing UB self care and grooming  PTA. Pt reports she uses brief and staff assist her with hygiene. Pt verbalized they use hoyer to get her into chair if she is going to an appointment but spends most of her time in bed. Pt oriented to self, location, and month. She reports being at hospital for "bad arthritis".  Pt finishing breakfast with set up and needing mod A for repositioning in bed for safety. Pt then began to have BM in bed and therapist offered to place pt on bed pan but she declined. Pt rolling L <> R with mod A but reports she is "still going" and wants to focus on finishing BM. OT notified RN for hygiene needs.  Patient will benefit from acute OT to increase overall independence in the areas of ADLs, functional mobility, and safety awarness in order to safely discharge back to SNF.    Follow Up Recommendations  SNF    Equipment Recommendations  Other (comment) (defer to next venue of care)       Precautions / Restrictions  Precautions Precautions: Fall      Mobility Bed Mobility Overal bed mobility: Needs Assistance Bed Mobility: Rolling Rolling: Mod assist         General bed mobility comments: rolling L <> R with mod cuing for hand placement and technique    Transfers     General transfer comment: not attempted secondary to safety        ADL either performed or assessed with clinical judgement   ADL Overall ADL's : Needs assistance/impaired     Grooming: Wash/dry hands;Wash/dry face;Oral care;Minimal assistance;Bed level      Lower Body Dressing: Total assistance;Bed level        General ADL Comments: Pt reports performing her UB self care from bed level and needing max - total A for LB dressing.     Vision Patient Visual Report: No change from baseline              Pertinent Vitals/Pain Pain Assessment: No/denies pain     Hand Dominance Right   Extremity/Trunk Assessment Upper Extremity Assessment Upper Extremity Assessment: Generalized weakness   Lower Extremity Assessment Lower Extremity Assessment: Defer to PT evaluation       Communication Communication Communication: No difficulties   Cognition Arousal/Alertness: Awake/alert Behavior During Therapy: WFL for tasks assessed/performed Overall Cognitive Status: Within Functional Limits for tasks assessed      General Comments: alert and oriented to location, month, and self. Pt reports she is in hospital for "arthritis"  Home Living Family/patient expects to be discharged to:: Skilled nursing facility          Prior Functioning/Environment Level of Independence: Needs assistance  Gait / Transfers Assistance Needed: Daughter reports pt does not ambulate at baseline and pt reports use of hoyer lift to transfer into wheelchair/recliner ADL's / Homemaking Assistance Needed: SNF assisted with all ADLs including bathing/dressing. They also helped with feeding due to UE tremors Communication /  Swallowing Assistance Needed: NA          OT Problem List: Decreased strength;Decreased activity tolerance;Decreased safety awareness;Decreased knowledge of use of DME or AE;Impaired balance (sitting and/or standing);Decreased cognition      OT Treatment/Interventions: Self-care/ADL training;Therapeutic exercise;Therapeutic activities;Energy conservation;DME and/or AE instruction;Patient/family education;Balance training    OT Goals(Current goals can be found in the care plan section) Acute Rehab OT Goals Patient Stated Goal: none stated OT Goal Formulation: With patient Time For Goal Achievement: 06/29/20 Potential to Achieve Goals: Good ADL Goals Pt Will Perform Grooming: with set-up;with supervision Pt Will Perform Upper Body Bathing: with set-up;with supervision Pt Will Perform Upper Body Dressing: with supervision;with set-up Pt/caregiver will Perform Home Exercise Program: Increased strength;With theraband;With minimal assist;With written HEP provided  OT Frequency: Min 1X/week   Barriers to D/C:    none known at this time          AM-PAC OT "6 Clicks" Daily Activity     Outcome Measure Help from another person eating meals?: A Little Help from another person taking care of personal grooming?: A Little Help from another person toileting, which includes using toliet, bedpan, or urinal?: Total Help from another person bathing (including washing, rinsing, drying)?: Total Help from another person to put on and taking off regular upper body clothing?: A Little Help from another person to put on and taking off regular lower body clothing?: Total 6 Click Score: 12   End of Session Nurse Communication: Mobility status;Other (comment) (BM)  Activity Tolerance: Patient limited by fatigue Patient left: in bed;with call bell/phone within reach;with bed alarm set  OT Visit Diagnosis: Muscle weakness (generalized) (M62.81);History of falling (Z91.81)                Time:  4196-2229 OT Time Calculation (min): 24 min Charges:  OT General Charges $OT Visit: 1 Visit OT Evaluation $OT Eval Moderate Complexity: 1 Mod OT Treatments $Self Care/Home Management : 8-22 mins  Jackquline Denmark, MS, OTR/L , CBIS ascom [phone]  06/15/20, 1:22 PM

## 2020-06-15 NOTE — Progress Notes (Signed)
PT Cancellation Note  Patient Details Name: Annette Combs MRN: [id number] DOB: Nov 13, 1928   Cancelled Treatment:      Patient not available this AM, PT attempted PT evaluation but daughter was saying goodbye and then patient needed bed change from BM. Will re-attempt PT evaluation when patient is available and therapist is available. Thank you for this referral.   Navea Woodrow PT, DPT 06/15/2020, 12:08 PM

## 2020-06-15 NOTE — Plan of Care (Signed)

## 2020-06-15 NOTE — Progress Notes (Signed)
PROGRESS NOTE    Annette Combs  PYP:[id number] DOB: [birth year] DOA: 06/13/2020 PCP: Mickey Farber, MD  Brief Narrative:  86 y.o. female with medical history significant of hypertension, hyperlipidemia, asthma, GERD, depression, anxiety, anemia, urinary incontinence, CAD, CPPD, who presents with fall and left eye injury, and AMS  Per report, patient is usually alert and conversant, but became more confused last evening.  Although nursing staff states a sitter was in the patient's room, patient fell from her bed, striking her left face. Pt is unable to recall events. Pt injured her left eye with little bleeding from left eye.  Seen by ophthalmology consultation.  Noted to be small amount of orbital hemorrhage around posterior globe with associated proptosis and periorbital soft tissue swelling.  Recommended cool compress and artificial tears 4 times a day with follow-up in [facility] 1 to 2 weeks post discharge.  11/22: [name] exam is improving clinically.  She endorses pain in her right upper extremity.  Very tender to touch.  11/23: [name] eye exam continues to improve.  Periorbital swelling is decreased.  Visual acuity believes at baseline.  Right upper extremity pain improved.  Imaging survey negative.   Assessment & Plan:   Principal Problem:   Eye injury Active Problems:   CAD (coronary artery disease)   GERD (gastroesophageal reflux disease)   Hypertension   Pure hypercholesterolemia   Fall   Asthma   Depression   Acute metabolic encephalopathy   Iron deficiency anemia  Eye injury  CT scan showed hemorrhage/injury at the junction of the LEFT optic nerve and LEFT globe.  Dr. Druscilla Brownie of ophthalmology is consulted. No acute intervention from ophthalmology standpoint Recommend cool compress and artificial  tears 3-4 times a day Plan: Artificial tears 4 times daily Cool compress 3 times daily As needed oral pain medications Outpatient follow-up with  ophthalmology 1 to 2 weeks post discharge  Acute metabolic encephalopathy Etiology is not clear.  Maybe due to delirium.  CT head is negative for acute intracranial abnormalities. Baseline level of mentation is unclear Urinalysis indicative of infection -Frequent neuro check -Rocephin for UTI.  Plan for 3-day course  Urinary tract infection Patient not obviously symptomatic however urine is grossly dirty Received Rocephin 1 g IV Will continue for 3-day total course.  Last dose 06/16/2020 Follow-up urinalysis and urine culture  Fall  Patient complains of left upper thigh pain and left hip pain -No fracture noted on x-ray of left hip/pelvis/femur -Patient complaining of severe right arm pain we will pursue imaging of the right forearm, wrist, elbow.  Negative for fracture -Pain control as needed   CAD (coronary artery disease)  No chest pain -Continue Lipitor -Hold aspirin due to  periorbital hematoma  GERD (gastroesophageal reflux disease) -Protonix  Hypertension -IV hydralazine as needed -Amlodipine  Pure hypercholesterolemia -Lipitor   Asthma  Stable -Bronchodilators  Depression -Continue home medications: BuSpar, Lexapro   iron deficiency anemia  Hemoglobin stable 12.4 -Continue home iron supplement  Prednisone use  Patient is taking prednisone 5 mg daily, not sure why patient is taking this medication.  Patient has history of CPPD and asthma. -Status post solu-Cortef 50 mg x 1 -Can resume prednisone 5 mg daily    DVT prophylaxis: SCD Code Status: DNR Family Communication: Daughter Pandora via phone [phone] on 06/14/2020 Disposition Plan: Status is: Inpatient  Remains inpatient appropriate because:Unsafe d/c plan, IV treatments appropriate due to intensity of illness or inability to take PO and Inpatient level of care appropriate due  to severity of illness   Dispo: The patient is from: ALF              Anticipated d/c is to: SNF               Anticipated d/c date is: 1 day              Patient currently is not medically stable to d/c.   Anticipated disposition to skilled nursing facility.  Patient comes from assisted living facility I believe.  TOC aware is looking for appropriate disposition.  Patient will complete her last dose of Rocephin for UTI in house tomorrow 06/16/2020.  At that time if mentation is at baseline and patient's pain is controlled can plan on discharge to skilled nursing facility.   Consultants:   None  Procedures:   None  Antimicrobials:   Ceftriaxone   Subjective: Patient seen and examined.  Baseline level of mentation.  Periorbital swelling improved.  Pain in right hand is improved  Objective: Vitals:   06/15/20 0300 06/15/20 0526 06/15/20 0754 06/15/20 1130  BP:  (!) 148/66 (!) 141/65 135/69  Pulse:  72 79 80  Resp:  17 16 15   Temp:  98 F (36.7 C) 98.8 F (37.1 C) 99.1 F (37.3 C)  TempSrc:  Oral  Oral  SpO2: 100% 99% 99% 97%  Weight:      Height:        Intake/Output Summary (Last 24 hours) at 06/15/2020 1530 Last data filed at 06/15/2020 1405 Gross per 24 hour  Intake 460 ml  Output 1100 ml  Net -640 ml   Filed Weights   06/13/20 0449  Weight: 100 kg    Examination:  General exam: Mild distress due to pain Respiratory system: Poor respiratory effort.  Bibasilar crackles.  Normal work of breathing.  Room air Cardiovascular system: S1-S2, 2/6 systolic murmur, no pedal edema  gastrointestinal system: Obese, nontender, nondistended, normal bowel sounds Central nervous system: Alert and awake, oriented x2, no focal deficits Extremities: Decreased power and range of motion bilateral upper and lower extremities Skin: No rashes, lesions or ulcers Psychiatry: Judgement and insight appear impaired. Mood & affect flattened.     Data Reviewed: I have personally reviewed following labs and imaging studies  CBC: Recent Labs  Lab 06/13/20 0453 06/14/20 0216  06/15/20 0537  WBC 6.0 7.2 7.0  NEUTROABS 3.3  --   --   HGB 12.4 11.1* 10.7*  HCT 38.9 34.8* 33.4*  MCV 100.5* 102.4* 101.5*  PLT 165 144* 145*   Basic Metabolic Panel: Recent Labs  Lab 06/13/20 0453 06/14/20 0216  NA 141 145  K 3.7 3.1*  CL 99 101  CO2 33* 34*  GLUCOSE 102* 94  BUN 12 9  CREATININE 0.45 0.40*  CALCIUM 9.8 9.3   GFR: Estimated Creatinine Clearance: 52.8 mL/min (A) (by C-G formula based on SCr of 0.4 mg/dL (L)). Liver Function Tests: Recent Labs  Lab 06/13/20 0453  AST 19  ALT 14  ALKPHOS 51  BILITOT 1.0  PROT 7.3  ALBUMIN 4.1   No results for input(s): LIPASE, AMYLASE in the last 168 hours. No results for input(s): AMMONIA in the last 168 hours. Coagulation Profile: Recent Labs  Lab 06/13/20 0453  INR 1.0   Cardiac Enzymes: No results for input(s): CKTOTAL, CKMB, CKMBINDEX, TROPONINI in the last 168 hours. BNP (last 3 results) No results for input(s): PROBNP in the last 8760 hours. HbA1C: No results for input(s): HGBA1C in the  last 72 hours. CBG: Recent Labs  Lab 06/15/20 0754  GLUCAP 82   Lipid Profile: No results for input(s): CHOL, HDL, LDLCALC, TRIG, CHOLHDL, LDLDIRECT in the last 72 hours. Thyroid Function Tests: No results for input(s): TSH, T4TOTAL, FREET4, T3FREE, THYROIDAB in the last 72 hours. Anemia Panel: No results for input(s): VITAMINB12, FOLATE, FERRITIN, TIBC, IRON, RETICCTPCT in the last 72 hours. Sepsis Labs: Recent Labs  Lab 06/13/20 0459  LATICACIDVEN 1.4    Recent Results (from the past 240 hour(s))  Resp Panel by RT-PCR (Flu A&B, Covid) Nasopharyngeal Swab     Status: None   Collection Time: 06/13/20  4:53 AM   Specimen: Nasopharyngeal Swab; Nasopharyngeal(NP) swabs in vial transport medium  Result Value Ref Range Status   SARS Coronavirus 2 by RT PCR NEGATIVE NEGATIVE Final    Comment: (NOTE) SARS-CoV-2 target nucleic acids are NOT DETECTED.  The SARS-CoV-2 RNA is generally detectable in upper  respiratory specimens during the acute phase of infection. The lowest concentration of SARS-CoV-2 viral copies this assay can detect is 138 copies/mL. A negative result does not preclude SARS-Cov-2 infection and should not be used as the sole basis for treatment or other patient management decisions. A negative result may occur with  improper specimen collection/handling, submission of specimen other than nasopharyngeal swab, presence of viral mutation(s) within the areas targeted by this assay, and inadequate number of viral copies(<138 copies/mL). A negative result must be combined with clinical observations, patient history, and epidemiological information. The expected result is Negative.  Fact Sheet for Patients:  BloggerCourse.comhttps://www.fda.gov/media/152166/download  Fact Sheet for Healthcare Providers:  SeriousBroker.ithttps://www.fda.gov/media/152162/download  This test is no t yet approved or cleared by the [place] FDA and  has been authorized for detection and/or diagnosis of SARS-CoV-2 by FDA under an Emergency Use Authorization (EUA). This EUA will remain  in effect (meaning this test can be used) for the duration of the COVID-19 declaration under Section 564(b)(1) of the Act, 21 U.S.C.section 360bbb-3(b)(1), unless the authorization is terminated  or revoked sooner.       Influenza A by PCR NEGATIVE NEGATIVE Final   Influenza B by PCR NEGATIVE NEGATIVE Final    Comment: (NOTE) The Xpert Xpress SARS-CoV-2/FLU/RSV plus assay is intended as an aid in the diagnosis of influenza from Nasopharyngeal swab specimens and should not be used as a sole basis for treatment. Nasal washings and aspirates are unacceptable for Xpert Xpress SARS-CoV-2/FLU/RSV testing.  Fact Sheet for Patients: BloggerCourse.comhttps://www.fda.gov/media/152166/download  Fact Sheet for Healthcare Providers: SeriousBroker.ithttps://www.fda.gov/media/152162/download  This test is not yet approved or cleared by the [place] FDA and has been  authorized for detection and/or diagnosis of SARS-CoV-2 by FDA under an Emergency Use Authorization (EUA). This EUA will remain in effect (meaning this test can be used) for the duration of the COVID-19 declaration under Section 564(b)(1) of the Act, 21 U.S.C. section 360bbb-3(b)(1), unless the authorization is terminated or revoked.  Performed at [facility], [address] [place], [place] [address]          Radiology Studies: DG Forearm Right  Result Date: 06/14/2020 CLINICAL DATA:  86 year old female status post fall with pain. EXAM: RIGHT FOREARM - 2 VIEW COMPARISON:  None. FINDINGS: Incidental IV access at both the right wrist and antecubital fossa. Bone mineralization is within normal limits for age. No evidence of elbow joint effusion. No acute right radius or ulna fracture identified. Degenerative changes at the wrist. Alignment appears maintained at the wrist and elbow. IMPRESSION: No acute fracture  or dislocation identified about the right forearm. Electronically Signed   By: Odessa Fleming M.D.   On: 06/14/2020 10:54        Scheduled Meds: . amLODipine  10 mg Oral Daily  . atorvastatin  40 mg Oral Daily  . busPIRone  10 mg Oral TID  . cholecalciferol  4,000 Units Oral Daily  . escitalopram  10 mg Oral Daily  . ferrous sulfate  325 mg Oral Q breakfast  . irbesartan  300 mg Oral Daily   And  . hydrochlorothiazide  25 mg Oral Daily  . latanoprost  1 drop Both Eyes QHS  . magnesium oxide  400 mg Oral Daily  . pantoprazole  40 mg Oral Daily  . polyvinyl alcohol  1 drop Left Eye QID  . predniSONE  15 mg Oral Q breakfast  . senna  2 tablet Oral QHS   Continuous Infusions: . cefTRIAXone (ROCEPHIN)  IV 1 g (06/15/20 0917)     LOS: 2 days    Time spent: 15 minutes    Tresa Moore, MD [facility] Pager [phone]  If 7PM-7AM, please contact night-coverage 06/15/2020, 3:30 PM

## 2020-06-16 DIAGNOSIS — S0592XD Unspecified injury of left eye and orbit, subsequent encounter: Secondary | ICD-10-CM

## 2020-06-16 LAB — GLUCOSE, CAPILLARY: Glucose-Capillary: 99 mg/dL (ref 70–99)

## 2020-06-16 LAB — RESP PANEL BY RT-PCR (FLU A&B, COVID) ARPGX2
Influenza A by PCR: NEGATIVE
Influenza B by PCR: NEGATIVE
SARS Coronavirus 2 by RT PCR: NEGATIVE

## 2020-06-16 MED ORDER — ACETAMINOPHEN 500 MG PO TABS: 500.0000 mg | ORAL_TABLET | Freq: Four times a day (QID) | ORAL | Status: AC | PRN

## 2020-06-16 MED ORDER — ASPIRIN 81 MG PO TABS: 81.0000 mg | ORAL_TABLET | Freq: Every day | ORAL | Status: AC

## 2020-06-16 MED ORDER — POLYVINYL ALCOHOL 1.4 % OP SOLN: 1.0000 [drp] | Freq: Four times a day (QID) | OPHTHALMIC | Status: AC

## 2020-06-16 NOTE — Plan of Care (Signed)

## 2020-06-16 NOTE — Care Management Important Message (Signed)
Important Message  Patient Details  Name: Annette Combs MRN: [id number] Date of Birth: 07-Sep-1928   Medicare Important Message Given:  Yes     [name] 06/16/2020, 11:07 AM

## 2020-06-16 NOTE — Discharge Summary (Signed)
Annette Combs GNF:[id number] DOB: [birth year]  PCP: Mickey Farberhies, David, MD  Admitted from: [facility] Discharged to: [facility].  Admit date: 06/13/2020 Discharge date: 06/16/2020  RecommendGarnette Czechations for Outpatient Follow-up:    Follow-up Information    Porfilio, Chrissie NoaWilliam, MD. Schedule an appointment as soon as possible for a visit in 1 week(s).   Specialty: Ophthalmology Contact information: [address] [place] [address] [phone]        MD at current nursing home. Schedule an appointment as soon as possible for a visit.   Why: To be seen in 3 to 4 days with repeat labs (CBC & BMP)       Mickey Farberhies, David, MD. Schedule an appointment as soon as possible for a visit.   Specialty: Internal Medicine Contact information: [address] [place] [id number] [phone]                Home Health: None    Equipment/Devices: TBD at LTC facility.    Discharge Condition: Improved and stable   Code Status: DNR Diet recommendation:  Discharge Diet Orders (From admission, onward)    Start     Ordered   06/16/20 0000  Diet - low sodium heart healthy        06/16/20 1309           Discharge Diagnoses:  Principal Problem:   Eye injury Active Problems:   CAD (coronary artery disease)   GERD (gastroesophageal reflux disease)   Hypertension   Pure hypercholesterolemia   Fall   Asthma   Depression   Acute metabolic encephalopathy   Iron deficiency anemia   Brief Summary: 86 year old female, [facility], reportedly wheelchair mobile, past medical history significant for hypertension, hyperlipidemia, asthma, GERD, depression, anxiety, anemia, urinary incontinence, CAD, CPPD, who presented to the hospital after a fall, left eye injury and altered mental status.  As per report, patient is usually alert and conversant, but became more confused on the night prior to  admission.  Although nursing staff stated that a sitter was present in the patient's room, patient fell from her bed, striking her left face.  Patient was unable to recall events.  She injured her left eye with little bleeding from the left eye.  Ophthalmology consulted.  Assessment and plan:  Eye injury CT scan showed hemorrhage/injury at the junction of the LEFT optic nerve and LEFT globe. Dr. Druscilla BrowniePorfilio ofophthalmology consulted on the patient and his findings/recommendations are copied as below:  "There is a small amount of orbital hemorrhage collected along the posterior globe. There is some proptosis and periorbital soft tissue swelling. Globe appears intact.    1) Visually significant cataract OD. May revisit this as an outpatient if desired.   2) OHTN OD. I would reassess this , also as an outpatient. May be artifactual do to pt. Squeezing lids on attempted tonometry.  3) Orbital hemorrhage and [facility] without compartment syndrome. Treat this with cool compresses and artificial tears 3-4 times daily.  [facility] 1-2 weeks after discharge."  Patient reports feeling better.  Slightly blurred vision in left eye but no pain.  As per patient and nursing, left eye swelling has significantly improved. I communicated with Dr. Druscilla BrowniePorfilio who advised to resume her home dose of aspirin 81 mg daily at discharge and will follow up as outpatient  Acute metabolic encephalopathy Etiology is not clear. Maybe due to delirium.  CT head is negative for acute intracranial abnormalities. Baseline level of mentation is unclear Urinalysis indicative of infection She completed 3 days course of IV ceftriaxone for presumed acute cystitis. Seems alert and oriented x2.  This may be her current mental status.  May follow-up at LTC.  Acute lower urinary tract infection Patient not obviously symptomatic however urine is grossly dirty She completed 3 days of IV ceftriaxone today.   Completed course.  It does not appear that her urine culture was sent and she was treated empirically.  She did have microscopic hematuria.  Recommend repeating urine microscopy in 2 to 3 weeks and if this persists, may consider further evaluation.  Fall  Patient complains of left upper thigh pain and left hip pain -No fracture noted on x-ray of left hip/pelvis/femur -Patient complained of severe right arm pain and right forearm x-ray was negative for fractures. -Improved and did not report pain during today's visit.  CAD (coronary artery disease)  No chest pain -Continue Lipitor -Resume aspirin 81 mg daily at DC as confirmed with ophthalmology.  GERD (gastroesophageal reflux disease) -Protonix  Hypertension -Controlled.  Continue prior home regimen.  Unclear if she has history of undetermined CHF, on Lasix PTA, resume at discharge  Pure hypercholesterolemia -Lipitor  Asthma  Stable -Bronchodilators  Depression -Continue home medications: BuSpar, Lexapro  Iron deficiency anemia  Hemoglobin stable 12.4 -Continue home iron supplement -Hemoglobin did drop from 12.4 on admission to 10.7 about 48 hours later, may be some degree of acute blood loss anemia related to the fall.  Follow CBCs closely at SNF. -Also noted mild stable thrombocytopenia in the mid 140s.  Follow CBCs at SNF.  Hypokalemia: This was replaced on 06/14/2020.  Follow BMP as outpatient.  Prednisone use Patient is taking prednisone 5 mg daily, not sure why patient istakingthismedication. Patient has history of CPPD and asthma. -Status post solu-Cortef 50 mg x 1 -Can resume prednisone 5 mg daily   Consultants:   None  Procedures:   None  Discharge Instructions  Discharge Instructions    (HEART FAILURE PATIENTS) Call MD:  Anytime you have any of the following symptoms: 1) 3 pound weight gain in 24 hours or 5 pounds in 1 week 2) shortness of breath, with or without a dry hacking cough  3) swelling in the hands, feet or stomach 4) if you have to sleep on extra pillows at night in order to breathe.   Complete by: As directed    Call MD for:   Complete by: As directed    Worsening or recurrence of left visual abnormalities.   Call MD for:  difficulty breathing, headache or visual disturbances   Complete by: As directed    Call MD for:  extreme fatigue   Complete by: As directed    Call MD for:  persistant dizziness or light-headedness   Complete by: As directed    Call MD for:  persistant nausea and vomiting   Complete by: As directed    Call MD for:  severe uncontrolled pain   Complete by: As directed    Call MD for:  temperature >100.4   Complete by: As directed    Diet - low sodium heart healthy   Complete by: As directed    Increase activity slowly   Complete by: As directed        Medication List    TAKE these medications   acetaminophen 500 MG tablet Commonly known as: TYLENOL Take 1 tablet (500 mg total)  by mouth every 6 (six) hours as needed for mild pain, moderate pain, fever or headache. What changed: reasons to take this   albuterol 108 (90 Base) MCG/ACT inhaler Commonly known as: VENTOLIN HFA Inhale 1-2 puffs into the lungs every 6 (six) hours as needed for wheezing or shortness of breath.   amLODipine 10 MG tablet Commonly known as: NORVASC Take 10 mg by mouth daily.   aspirin 81 MG tablet Take 1 tablet (81 mg total) by mouth daily. What changed: when to take this   atorvastatin 40 MG tablet Commonly known as: LIPITOR Take 40 mg by mouth daily.   busPIRone 10 MG tablet Commonly known as: BUSPAR Take 10 mg by mouth 3 (three) times daily.   cholecalciferol 25 MCG (1000 UNIT) tablet Commonly known as: VITAMIN D3 Take 4,000 Units by mouth daily.   diclofenac Sodium 1 % Gel Commonly known as: VOLTAREN Apply 4 g topically 4 (four) times daily.   escitalopram 10 MG tablet Commonly known as: LEXAPRO Take 10 mg by mouth daily.    estradiol 0.1 MG/GM vaginal cream Commonly known as: ESTRACE Place 1 Applicatorful vaginally at bedtime.   furosemide 20 MG tablet Commonly known as: LASIX Take 20 mg by mouth 2 (two) times daily.   latanoprost 0.005 % ophthalmic solution Commonly known as: XALATAN Place 1 drop into both eyes at bedtime.   MAGNESIUM OXIDE PO Take 500 mg by mouth daily.   MiraLax 17 g packet Generic drug: polyethylene glycol Take 17 g by mouth daily.   omeprazole 20 MG capsule Commonly known as: PRILOSEC Take 20 mg by mouth daily.   polyvinyl alcohol 1.4 % ophthalmic solution Commonly known as: LIQUIFILM TEARS Place 1 drop into the left eye 4 (four) times daily.   potassium chloride 10 MEQ tablet Commonly known as: KLOR-CON Take 10 mEq by mouth 2 (two) times daily.   predniSONE 10 MG tablet Commonly known as: DELTASONE Take 5 mg by mouth daily with breakfast.   senna 8.6 MG Tabs tablet Commonly known as: SENOKOT Take 2 tablets by mouth at bedtime.   SM Iron 325 (65 FE) MG tablet Generic drug: ferrous sulfate Take 325 mg by mouth.   traMADol 50 MG tablet Commonly known as: ULTRAM Take 50 mg by mouth 2 (two) times daily.   valsartan-hydrochlorothiazide 320-25 MG tablet Commonly known as: DIOVAN-HCT Take 1 tablet by mouth daily.   vitamin B-12 250 MCG tablet Commonly known as: CYANOCOBALAMIN Take 250 mcg by mouth every 14 (fourteen) days.      No Known Allergies    Procedures/Studies: DG Pelvis 1-2 Views  Result Date: 06/13/2020 CLINICAL DATA:  Pain after fall EXAM: PELVIS - 1-2 VIEW COMPARISON:  None. FINDINGS: Evaluation of the hips is limited due to positioning, particularly on the right. No fractures are seen. IMPRESSION: Limited evaluation of the hips due to positioning. No convincing evidence of fracture. The pelvic bones are unremarkable. Electronically Signed   By: Gerome Sam III M.D   On: 06/13/2020 15:30   DG Forearm Right  Result Date:  06/14/2020 CLINICAL DATA:  86 year old female status post fall with pain. EXAM: RIGHT FOREARM - 2 VIEW COMPARISON:  None. FINDINGS: Incidental IV access at both the right wrist and antecubital fossa. Bone mineralization is within normal limits for age. No evidence of elbow joint effusion. No acute right radius or ulna fracture identified. Degenerative changes at the wrist. Alignment appears maintained at the wrist and elbow. IMPRESSION: No acute fracture or dislocation identified  about the right forearm. Electronically Signed   By: Odessa Fleming M.D.   On: 06/14/2020 10:54   CT Head Wo Contrast  Result Date: 06/13/2020 CLINICAL DATA:  86 year old female with head, face and neck injury following fall. Initial encounter. EXAM: CT HEAD WITHOUT CONTRAST CT MAXILLOFACIAL WITHOUT CONTRAST CT CERVICAL SPINE WITHOUT CONTRAST TECHNIQUE: Multidetector CT imaging of the head, cervical spine, and maxillofacial structures were performed using the standard protocol without intravenous contrast. Multiplanar CT image reconstructions of the cervical spine and maxillofacial structures were also generated. COMPARISON:  None. FINDINGS: CT HEAD FINDINGS Brain: No evidence of acute infarction, hemorrhage, hydrocephalus, extra-axial collection or mass lesion/mass effect. Moderate periventricular white matter hypodensities likely represents chronic small-vessel white matter ischemic changes. Vascular: Carotid atherosclerotic calcifications are noted. Skull: No acute fracture. Other: Mild forehead soft tissue swelling noted. CT MAXILLOFACIAL FINDINGS Osseous: No acute fracture is identified. No subluxation or dislocation noted. Orbits: There is hemorrhage along the posterior aspect of the LEFT globe, at the junction of the optic nerve and globe. The globe retains its spherical shape and there is no gross intraglobal abnormality. LEFT proptosis and LEFT preseptal soft tissue swelling is also noted. The RIGHT orbit is unremarkable. There is  no evidence of acute orbital fracture. Sinuses: A RIGHT mastoid effusion is noted without identifiable fracture. The middle and inner ears are clear bilaterally. The paranasal sinuses are clear. Soft tissues: Forehead soft tissue swelling is noted. CT CERVICAL SPINE FINDINGS Alignment: Loss of the normal cervical lordosis is noted without subluxation. Skull base and vertebrae: No acute fracture. No primary bone lesion or focal pathologic process. Soft tissues and spinal canal: No prevertebral fluid or swelling. No visible canal hematoma. Disc levels: Moderate multilevel degenerative disc disease/spondylosis and facet arthropathy noted. Upper chest: No acute abnormality Other: None IMPRESSION: 1. Hemorrhage/injury at the junction of the LEFT optic nerve and LEFT globe. LEFT proptosis without evidence of gross intraglobal abnormality. LEFT preseptal soft tissue swelling without orbital fracture. 2. No evidence of acute intracranial abnormality. Moderate chronic small-vessel white matter ischemic changes. 3. No static evidence of acute injury to the cervical spine. Moderate multilevel degenerative disc disease/spondylosis and facet arthropathy. 4. RIGHT mastoid effusion without identifiable mastoid fracture. Middle and inner ears are clear. Electronically Signed   By: Harmon Pier M.D.   On: 06/13/2020 06:08   CT Cervical Spine Wo Contrast  Result Date: 06/13/2020 CLINICAL DATA:  86 year old female with head, face and neck injury following fall. Initial encounter. EXAM: CT HEAD WITHOUT CONTRAST CT MAXILLOFACIAL WITHOUT CONTRAST CT CERVICAL SPINE WITHOUT CONTRAST TECHNIQUE: Multidetector CT imaging of the head, cervical spine, and maxillofacial structures were performed using the standard protocol without intravenous contrast. Multiplanar CT image reconstructions of the cervical spine and maxillofacial structures were also generated. COMPARISON:  None. FINDINGS: CT HEAD FINDINGS Brain: No evidence of acute  infarction, hemorrhage, hydrocephalus, extra-axial collection or mass lesion/mass effect. Moderate periventricular white matter hypodensities likely represents chronic small-vessel white matter ischemic changes. Vascular: Carotid atherosclerotic calcifications are noted. Skull: No acute fracture. Other: Mild forehead soft tissue swelling noted. CT MAXILLOFACIAL FINDINGS Osseous: No acute fracture is identified. No subluxation or dislocation noted. Orbits: There is hemorrhage along the posterior aspect of the LEFT globe, at the junction of the optic nerve and globe. The globe retains its spherical shape and there is no gross intraglobal abnormality. LEFT proptosis and LEFT preseptal soft tissue swelling is also noted. The RIGHT orbit is unremarkable. There is no evidence of acute orbital fracture. Sinuses:  A RIGHT mastoid effusion is noted without identifiable fracture. The middle and inner ears are clear bilaterally. The paranasal sinuses are clear. Soft tissues: Forehead soft tissue swelling is noted. CT CERVICAL SPINE FINDINGS Alignment: Loss of the normal cervical lordosis is noted without subluxation. Skull base and vertebrae: No acute fracture. No primary bone lesion or focal pathologic process. Soft tissues and spinal canal: No prevertebral fluid or swelling. No visible canal hematoma. Disc levels: Moderate multilevel degenerative disc disease/spondylosis and facet arthropathy noted. Upper chest: No acute abnormality Other: None IMPRESSION: 1. Hemorrhage/injury at the junction of the LEFT optic nerve and LEFT globe. LEFT proptosis without evidence of gross intraglobal abnormality. LEFT preseptal soft tissue swelling without orbital fracture. 2. No evidence of acute intracranial abnormality. Moderate chronic small-vessel white matter ischemic changes. 3. No static evidence of acute injury to the cervical spine. Moderate multilevel degenerative disc disease/spondylosis and facet arthropathy. 4. RIGHT mastoid  effusion without identifiable mastoid fracture. Middle and inner ears are clear. Electronically Signed   By: Harmon Pier M.D.   On: 06/13/2020 06:08   DG Chest Port 1 View  Result Date: 06/13/2020 CLINICAL DATA:  Fall and pain. EXAM: PORTABLE CHEST 1 VIEW COMPARISON:  12/18/2018 chest radiograph FINDINGS: This is a low volume and rotated study due to patient condition. Cardiomegaly with bilateral interstitial and hazy opacities are noted, but not significantly changed. Bibasilar atelectasis/scarring again identified. No pneumothorax or large pleural effusion noted. No acute bony abnormalities are identified. IMPRESSION: Unchanged appearance of the chest.  No acute abnormalities noted. Cardiomegaly with chronic appearing bilateral interstitial and hazy opacities. Electronically Signed   By: Harmon Pier M.D.   On: 06/13/2020 05:52   DG FEMUR MIN 2 VIEWS LEFT  Result Date: 06/13/2020 CLINICAL DATA:  Pain after fall. EXAM: LEFT FEMUR 2 VIEWS COMPARISON:  None. FINDINGS: Degenerative changes in the left knee. Vascular calcifications. No fracture in the femur. No evidence of dislocation. The lateral view is limited at the level the femoral head. IMPRESSION: No fractures or dislocations. Degenerative changes in the left knee. Vascular calcifications. Electronically Signed   By: Gerome Sam III M.D   On: 06/13/2020 15:29   CT Maxillofacial WO CM  Result Date: 06/13/2020 CLINICAL DATA:  86 year old female with head, face and neck injury following fall. Initial encounter. EXAM: CT HEAD WITHOUT CONTRAST CT MAXILLOFACIAL WITHOUT CONTRAST CT CERVICAL SPINE WITHOUT CONTRAST TECHNIQUE: Multidetector CT imaging of the head, cervical spine, and maxillofacial structures were performed using the standard protocol without intravenous contrast. Multiplanar CT image reconstructions of the cervical spine and maxillofacial structures were also generated. COMPARISON:  None. FINDINGS: CT HEAD FINDINGS Brain: No evidence of  acute infarction, hemorrhage, hydrocephalus, extra-axial collection or mass lesion/mass effect. Moderate periventricular white matter hypodensities likely represents chronic small-vessel white matter ischemic changes. Vascular: Carotid atherosclerotic calcifications are noted. Skull: No acute fracture. Other: Mild forehead soft tissue swelling noted. CT MAXILLOFACIAL FINDINGS Osseous: No acute fracture is identified. No subluxation or dislocation noted. Orbits: There is hemorrhage along the posterior aspect of the LEFT globe, at the junction of the optic nerve and globe. The globe retains its spherical shape and there is no gross intraglobal abnormality. LEFT proptosis and LEFT preseptal soft tissue swelling is also noted. The RIGHT orbit is unremarkable. There is no evidence of acute orbital fracture. Sinuses: A RIGHT mastoid effusion is noted without identifiable fracture. The middle and inner ears are clear bilaterally. The paranasal sinuses are clear. Soft tissues: Forehead soft tissue swelling is noted.  CT CERVICAL SPINE FINDINGS Alignment: Loss of the normal cervical lordosis is noted without subluxation. Skull base and vertebrae: No acute fracture. No primary bone lesion or focal pathologic process. Soft tissues and spinal canal: No prevertebral fluid or swelling. No visible canal hematoma. Disc levels: Moderate multilevel degenerative disc disease/spondylosis and facet arthropathy noted. Upper chest: No acute abnormality Other: None IMPRESSION: 1. Hemorrhage/injury at the junction of the LEFT optic nerve and LEFT globe. LEFT proptosis without evidence of gross intraglobal abnormality. LEFT preseptal soft tissue swelling without orbital fracture. 2. No evidence of acute intracranial abnormality. Moderate chronic small-vessel white matter ischemic changes. 3. No static evidence of acute injury to the cervical spine. Moderate multilevel degenerative disc disease/spondylosis and facet arthropathy. 4. RIGHT  mastoid effusion without identifiable mastoid fracture. Middle and inner ears are clear. Electronically Signed   By: Harmon Pier M.D.   On: 06/13/2020 06:08      Subjective: Patient denies complaints.  States that her left eye is doing better with improved vision although still somewhat blurred.  Denies pain or swelling in that eye.  As per RN, no acute issues reported, pain controlled with Tylenol alone and ate a little this morning.  As per [facility] team, patient is a resident of above LTC where she moves around with the help of a wheelchair and will be returning back to the facility.  Discharge Exam:  Vitals:   06/15/20 2358 06/16/20 0401 06/16/20 0808 06/16/20 1234  BP: 137/61 123/70 136/68 132/73  Pulse: 69 72 80 73  Resp: Temp: 98 F (36.7 C) 98.5 F (36.9 C) 98.8 F (37.1 C) 98.5 F (36.9 C)  TempSrc: Oral Oral Oral Oral  SpO2: 100% 100% 100% 96%  Weight:      Height:        General: Pt lying comfortably in bed & appears in no obvious distress. Cardiovascular: S1 & S2 heard, RRR, S1/S2 +. No murmurs, rubs, gallops or clicks. No JVD or pedal edema.  Telemetry personally reviewed: Sinus rhythm. Respiratory: Clear to auscultation without wheezing, rhonchi or crackles. No increased work of breathing. Abdominal:  Non distended, non tender & soft. No organomegaly or masses appreciated. Normal bowel sounds heard. CNS: Alert and oriented. No focal deficits. Extremities: no edema, no cyanosis Eyes: Left eye with subconjunctival patchy hemorrhage.  Pupils 3 mm equally reacting to light and accommodation.  No overt proptosis appreciated.  No photophobia    The results of significant diagnostics from this hospitalization (including imaging, microbiology, ancillary and laboratory) are listed below for reference.     Microbiology: Recent Results (from the past 240 hour(s))  Resp Panel by RT-PCR (Flu A&B, Covid) Nasopharyngeal Swab     Status: None   Collection Time:  06/13/20  4:53 AM   Specimen: Nasopharyngeal Swab; Nasopharyngeal(NP) swabs in vial transport medium  Result Value Ref Range Status   SARS Coronavirus 2 by RT PCR NEGATIVE NEGATIVE Final    Comment: (NOTE) SARS-CoV-2 target nucleic acids are NOT DETECTED.  The SARS-CoV-2 RNA is generally detectable in upper respiratory specimens during the acute phase of infection. The lowest concentration of SARS-CoV-2 viral copies this assay can detect is 138 copies/mL. A negative result does not preclude SARS-Cov-2 infection and should not be used as the sole basis for treatment or other patient management decisions. A negative result may occur with  improper specimen collection/handling, submission of specimen other than nasopharyngeal swab, presence of viral mutation(s) within the areas targeted by this  assay, and inadequate number of viral copies(<138 copies/mL). A negative result must be combined with clinical observations, patient history, and epidemiological information. The expected result is Negative.  Fact Sheet for Patients:  BloggerCourse.com  Fact Sheet for Healthcare Providers:  SeriousBroker.it  This test is no t yet approved or cleared by the [place] FDA and  has been authorized for detection and/or diagnosis of SARS-CoV-2 by FDA under an Emergency Use Authorization (EUA). This EUA will remain  in effect (meaning this test can be used) for the duration of the COVID-19 declaration under Section 564(b)(1) of the Act, 21 U.S.C.section 360bbb-3(b)(1), unless the authorization is terminated  or revoked sooner.       Influenza A by PCR NEGATIVE NEGATIVE Final   Influenza B by PCR NEGATIVE NEGATIVE Final    Comment: (NOTE) The Xpert Xpress SARS-CoV-2/FLU/RSV plus assay is intended as an aid in the diagnosis of influenza from Nasopharyngeal swab specimens and should not be used as a sole basis for treatment. Nasal washings  and aspirates are unacceptable for Xpert Xpress SARS-CoV-2/FLU/RSV testing.  Fact Sheet for Patients: BloggerCourse.com  Fact Sheet for Healthcare Providers: SeriousBroker.it  This test is not yet approved or cleared by the [place] FDA and has been authorized for detection and/or diagnosis of SARS-CoV-2 by FDA under an Emergency Use Authorization (EUA). This EUA will remain in effect (meaning this test can be used) for the duration of the COVID-19 declaration under Section 564(b)(1) of the Act, 21 U.S.C. section 360bbb-3(b)(1), unless the authorization is terminated or revoked.  Performed at [facility], [address] [place], [place] [address]   Resp Panel by RT-PCR (Flu A&B, Covid) Nasopharyngeal Swab     Status: None   Collection Time: 06/16/20  8:48 AM   Specimen: Nasopharyngeal Swab; Nasopharyngeal(NP) swabs in vial transport medium  Result Value Ref Range Status   SARS Coronavirus 2 by RT PCR NEGATIVE NEGATIVE Final    Comment: (NOTE) SARS-CoV-2 target nucleic acids are NOT DETECTED.  The SARS-CoV-2 RNA is generally detectable in upper respiratory specimens during the acute phase of infection. The lowest concentration of SARS-CoV-2 viral copies this assay can detect is 138 copies/mL. A negative result does not preclude SARS-Cov-2 infection and should not be used as the sole basis for treatment or other patient management decisions. A negative result may occur with  improper specimen collection/handling, submission of specimen other than nasopharyngeal swab, presence of viral mutation(s) within the areas targeted by this assay, and inadequate number of viral copies(<138 copies/mL). A negative result must be combined with clinical observations, patient history, and epidemiological information. The expected result is Negative.  Fact Sheet for Patients:  BloggerCourse.com  Fact  Sheet for Healthcare Providers:  SeriousBroker.it  This test is no t yet approved or cleared by the [place] FDA and  has been authorized for detection and/or diagnosis of SARS-CoV-2 by FDA under an Emergency Use Authorization (EUA). This EUA will remain  in effect (meaning this test can be used) for the duration of the COVID-19 declaration under Section 564(b)(1) of the Act, 21 U.S.C.section 360bbb-3(b)(1), unless the authorization is terminated  or revoked sooner.       Influenza A by PCR NEGATIVE NEGATIVE Final   Influenza B by PCR NEGATIVE NEGATIVE Final    Comment: (NOTE) The Xpert Xpress SARS-CoV-2/FLU/RSV plus assay is intended as an aid in the diagnosis of influenza from Nasopharyngeal swab specimens and should not be used as a sole basis for treatment. Nasal washings  and aspirates are unacceptable for Xpert Xpress SARS-CoV-2/FLU/RSV testing.  Fact Sheet for Patients: BloggerCourse.com  Fact Sheet for Healthcare Providers: SeriousBroker.it  This test is not yet approved or cleared by the [place] FDA and has been authorized for detection and/or diagnosis of SARS-CoV-2 by FDA under an Emergency Use Authorization (EUA). This EUA will remain in effect (meaning this test can be used) for the duration of the COVID-19 declaration under Section 564(b)(1) of the Act, 21 U.S.C. section 360bbb-3(b)(1), unless the authorization is terminated or revoked.  Performed at [facility], [address] [place], [place] [address]      Labs: CBC: Recent Labs  Lab 06/13/20 0453 06/14/20 0216 06/15/20 0537  WBC 6.0 7.2 7.0  NEUTROABS 3.3  --   --   HGB 12.4 11.1* 10.7*  HCT 38.9 34.8* 33.4*  MCV 100.5* 102.4* 101.5*  PLT 165 144* 145*    Basic Metabolic Panel: Recent Labs  Lab 06/13/20 0453 06/14/20 0216  NA 141 145  K 3.7 3.1*  CL 99 101  CO2 33* 34*  GLUCOSE 102* 94   BUN 12 9  CREATININE 0.45 0.40*  CALCIUM 9.8 9.3    Liver Function Tests: Recent Labs  Lab 06/13/20 0453  AST 19  ALT 14  ALKPHOS 51  BILITOT 1.0  PROT 7.3  ALBUMIN 4.1    CBG: Recent Labs  Lab 06/15/20 0754 06/16/20 0820  GLUCAP 82 99    Urinalysis    Component Value Date/Time   COLORURINE AMBER (A) 06/13/2020 1931   APPEARANCEUR CLOUDY (A) 06/13/2020 1931   LABSPEC 1.026 06/13/2020 1931   PHURINE 5.0 06/13/2020 1931   GLUCOSEU NEGATIVE 06/13/2020 1931   HGBUR NEGATIVE 06/13/2020 1931   BILIRUBINUR NEGATIVE 06/13/2020 1931   KETONESUR NEGATIVE 06/13/2020 1931   PROTEINUR 30 (A) 06/13/2020 1931   NITRITE NEGATIVE 06/13/2020 1931   LEUKOCYTESUR LARGE (A) 06/13/2020 1931    I discussed in detail with patient's daughter, updated care and answered questions and advised her she is being discharged to her nursing home today.  She was appreciative of the call.  Time coordinating discharge: 35 minutes  SIGNED:  Marcellus Scott, MD, FACP, [facility]. [facility]  To contact the attending provider between 7A-7P or the covering provider during after hours 7P-7A, please log into the web site [email or web address] and access using universal [place] password for that web site. If you do not have the password, please call the hospital operator.

## 2020-06-16 NOTE — Progress Notes (Signed)
Contacted SNF, [facility]. Reported to facility nurse, Morrie Sheldon, RN. Reviewed discharge instructions. Pt transported to facility via EMS.  Obtained vitals. IV cath intact upon discharge.  Pt discharged with personal belongings.

## 2020-06-16 NOTE — Discharge Instructions (Signed)

## 2020-07-07 ENCOUNTER — Other Ambulatory Visit
Admission: RE | Admit: 2020-07-07 | Discharge: 2020-07-07 | Disposition: A | Discharge status: Home / Self Care | Payer: Medicare Other | Source: Ambulatory Visit | Attending: General Practice | Admitting: General Practice

## 2020-07-07 DIAGNOSIS — R509 Fever, unspecified: Secondary | ICD-10-CM | POA: Diagnosis present

## 2020-07-07 DIAGNOSIS — R300 Dysuria: Secondary | ICD-10-CM | POA: Diagnosis present

## 2020-07-07 LAB — URINALYSIS, ROUTINE W REFLEX MICROSCOPIC
Bilirubin Urine: NEGATIVE
Glucose, UA: NEGATIVE mg/dL
Hgb urine dipstick: NEGATIVE
Ketones, ur: NEGATIVE mg/dL
Nitrite: POSITIVE — AB
Protein, ur: NEGATIVE mg/dL
Specific Gravity, Urine: 1.011 (ref 1.005–1.030)
pH: 6 (ref 5.0–8.0)

## 2020-07-09 LAB — URINE CULTURE: Culture: >=100000 — AB

## 2020-07-21 ENCOUNTER — Other Ambulatory Visit
Admission: RE | Admit: 2020-07-21 | Discharge: 2020-07-21 | Disposition: A | Discharge status: Home / Self Care | Payer: Medicare Other | Source: Ambulatory Visit | Attending: General Practice | Admitting: General Practice

## 2020-07-21 DIAGNOSIS — F4001 Agoraphobia with panic disorder: Secondary | ICD-10-CM | POA: Insufficient documentation

## 2020-07-21 DIAGNOSIS — M1990 Unspecified osteoarthritis, unspecified site: Secondary | ICD-10-CM | POA: Diagnosis not present

## 2020-07-21 DIAGNOSIS — M79672 Pain in left foot: Secondary | ICD-10-CM | POA: Insufficient documentation

## 2020-07-21 DIAGNOSIS — K219 Gastro-esophageal reflux disease without esophagitis: Secondary | ICD-10-CM | POA: Insufficient documentation

## 2020-07-21 DIAGNOSIS — D519 Vitamin B12 deficiency anemia, unspecified: Secondary | ICD-10-CM | POA: Diagnosis not present

## 2020-07-21 DIAGNOSIS — E785 Hyperlipidemia, unspecified: Secondary | ICD-10-CM | POA: Insufficient documentation

## 2020-07-21 DIAGNOSIS — G4700 Insomnia, unspecified: Secondary | ICD-10-CM | POA: Diagnosis present

## 2020-07-21 DIAGNOSIS — F329 Major depressive disorder, single episode, unspecified: Secondary | ICD-10-CM | POA: Diagnosis not present

## 2020-07-21 DIAGNOSIS — K5900 Constipation, unspecified: Secondary | ICD-10-CM | POA: Diagnosis not present

## 2020-07-21 DIAGNOSIS — Z9981 Dependence on supplemental oxygen: Secondary | ICD-10-CM | POA: Diagnosis not present

## 2020-07-21 DIAGNOSIS — I509 Heart failure, unspecified: Secondary | ICD-10-CM | POA: Insufficient documentation

## 2020-07-21 DIAGNOSIS — Z7901 Long term (current) use of anticoagulants: Secondary | ICD-10-CM | POA: Insufficient documentation

## 2020-07-21 DIAGNOSIS — R600 Localized edema: Secondary | ICD-10-CM | POA: Insufficient documentation

## 2020-07-21 DIAGNOSIS — H401134 Primary open-angle glaucoma, bilateral, indeterminate stage: Secondary | ICD-10-CM | POA: Diagnosis not present

## 2020-07-21 DIAGNOSIS — M1180 Other specified crystal arthropathies, unspecified site: Secondary | ICD-10-CM | POA: Insufficient documentation

## 2020-07-21 DIAGNOSIS — M170 Bilateral primary osteoarthritis of knee: Secondary | ICD-10-CM | POA: Diagnosis not present

## 2020-07-21 DIAGNOSIS — F419 Anxiety disorder, unspecified: Secondary | ICD-10-CM | POA: Insufficient documentation

## 2020-07-21 DIAGNOSIS — Z8619 Personal history of other infectious and parasitic diseases: Secondary | ICD-10-CM | POA: Insufficient documentation

## 2020-07-21 DIAGNOSIS — E876 Hypokalemia: Secondary | ICD-10-CM | POA: Insufficient documentation

## 2020-07-21 DIAGNOSIS — E559 Vitamin D deficiency, unspecified: Secondary | ICD-10-CM | POA: Diagnosis not present

## 2020-07-21 DIAGNOSIS — E612 Magnesium deficiency: Secondary | ICD-10-CM | POA: Insufficient documentation

## 2020-07-21 DIAGNOSIS — D649 Anemia, unspecified: Secondary | ICD-10-CM | POA: Insufficient documentation

## 2020-07-21 LAB — COMPREHENSIVE METABOLIC PANEL
ALT: 12 U/L (ref 0–44)
AST: 16 U/L (ref 15–41)
Albumin: 3.3 g/dL — ABNORMAL LOW (ref 3.5–5.0)
Alkaline Phosphatase: 68 U/L (ref 38–126)
Anion gap: 8 (ref 5–15)
BUN: 52 mg/dL — ABNORMAL HIGH (ref 8–23)
CO2: 38 mmol/L — ABNORMAL HIGH (ref 22–32)
Calcium: 10.3 mg/dL (ref 8.9–10.3)
Chloride: 92 mmol/L — ABNORMAL LOW (ref 98–111)
Creatinine, Ser: 3.12 mg/dL — ABNORMAL HIGH (ref 0.44–1.00)
GFR, Estimated: 14 mL/min — ABNORMAL LOW (ref >60)
Glucose, Bld: 89 mg/dL (ref 70–99)
Potassium: 4.5 mmol/L (ref 3.5–5.1)
Sodium: 138 mmol/L (ref 135–145)
Total Bilirubin: 1.1 mg/dL (ref 0.3–1.2)
Total Protein: 6.4 g/dL — ABNORMAL LOW (ref 6.5–8.1)

## 2020-08-11 ENCOUNTER — Other Ambulatory Visit: Payer: Self-pay

## 2020-08-11 ENCOUNTER — Non-Acute Institutional Stay: Payer: Medicare Other | Admitting: Primary Care

## 2020-08-11 DIAGNOSIS — Z8739 Personal history of other diseases of the musculoskeletal system and connective tissue: Secondary | ICD-10-CM

## 2020-08-11 DIAGNOSIS — F331 Major depressive disorder, recurrent, moderate: Secondary | ICD-10-CM

## 2020-08-11 DIAGNOSIS — I2510 Atherosclerotic heart disease of native coronary artery without angina pectoris: Secondary | ICD-10-CM

## 2020-08-11 DIAGNOSIS — M159 Polyosteoarthritis, unspecified: Secondary | ICD-10-CM

## 2020-08-11 DIAGNOSIS — N39498 Other specified urinary incontinence: Secondary | ICD-10-CM

## 2020-08-11 NOTE — Progress Notes (Addendum)
[facility] Pall[facility] Consult Note Telephone: [phone]  Fax: [phone]     Date of encounter: 08/11/20 PATIENT NAME: Annette Combs [address] [place] [address] [phone] (home)  DOB: 27-Apr-1929 MRN: [id number]  PRIMARY CARE PROVIDER:    Gennie Alma, MD,  [place] [address] [place]:   Gennie Alma, [place],  [place] [address] [phone]  RESPONSIBLE PARTY:   Extended Emergency Contact Information Primary Emergency Contact: Sherald Barge A Address: [address]          [place], [place] [address] [place] Phone: [phone] Mobile Phone: [phone] Relation: Daughter Secondary Emergency Contact: [place], [place] Mobile Phone: [phone] Relation: Son Interpreter needed? No  I met face to face with patient in the facility. Palliative Care was asked to follow this patient by consultation request of Gennie Alma, MD to help address advance care planning and goals of care. This is the initial visit.   ASSESSMENT AND RECOMMENDATIONS:   1. Advance Care Planning/Goals of Care: Goals include to maximize quality of life and symptom management.   Discussed decline with staff. Patient had been able to ambulate by w/c, do some basic adls and feed self 6 months ago. She was interactive and cheerful. Now per staff she is very withdrawn, eating very little and unable to assist herself with any adls. She must be fed and is dependent in all adls.   Staff endorses an increase in rate of decline since fall and hospital stay in 11/21.She recently has had some UTIs and most recently a clear culture at the SNF.  Her MOST selections on file dated 07/21/20 are DNR, Comfort measures, abx use and Iv use for trial period, no tube feeding. This was signed by son Thayer Jew. I have placed a call to both [facility] and Thayer Jew. I was able to leave a message with  [facility], but not with Thayer Jew.  She is hospice appropriate and I am happy to discuss services with family should they desire to explore this service.  2. Symptom Management:   Dysphagia: Staff endorse dysphagia, pill holding in mouth and decreased intake. Weight noted as 220 on hospital chart, 19% loss in 18 mos. She does drink cold water but has refused most food and nutritional supplements. Recommend to pour over ice to see if more palatable.  Pain: Denies, appears comfortable. Has PRNS.  Polypharmacy: Recommend to d/c nutritional supplements, vitamins, statin if desired to decrease pill burden.  3. Follow up Palliative Care Visit: Palliative care will continue to follow for goals of care clarification and symptom management. Return 3-4 weeks or prn.  4. Family /Caregiver/Community Supports: Son and daughter are [place], lives in [place] several years.  5. Cognitive / Functional decline: Alert, responds to questions. States desire for cold water. Dependent in all alds.  I spent 35 minutes providing this consultation,  from 1000 to 1035. More than 50% of the time in this consultation was spent coordinating communication.   CODE STATUS: DNR PPS: 30%  HOSPICE ELIGIBILITY/DIAGNOSIS: TBD  Subjective:  CHIEF COMPLAINT: dysphagia  HISTORY OF PRESENT ILLNESS:  Annette Combs is a 87 y.o. year old female  with h/o MDD, agoraphobia, obesity, OA, GERD, recurrent UTI, weight loss. She has declined in past 4-8 weeks from UTI, dysphagia and increased comfusion.   We are asked to consult around advance care planning and complex medical decision making.    Review and summarization  of old Epic records shows or history from other than patient. Review or lab tests, radiology,  or medicine =Recent U/A and C/s., Review of CT of spine and head from 11/21.  History obtained from review of EMR, discussion with primary team, and  interview with family, caregiver  and/or Ms. Slabaugh. Records reviewed and  summarized above.   CURRENT PROBLEM LIST:  Patient Active Problem List   Diagnosis Date Noted  . Fall 06/13/2020  . Asthma 06/13/2020  . Depression 06/13/2020  . Eye injury 06/13/2020  . Acute metabolic encephalopathy 06/13/2020  . Iron deficiency anemia 06/13/2020  . Anxiety 02/27/2016  . B12 deficiency 08/21/2015  . Moderate episode of recurrent major depressive disorder (HCC) 08/19/2015  . Urinary incontinence 02/25/2015  . Atrophic vaginitis 02/25/2015  . High risk medication use 01/28/2015  . Immunizations incomplete 12/23/2014  . Anemia 01/17/2014  . CAD (coronary artery disease) 01/17/2014  . GERD (gastroesophageal reflux disease) 01/17/2014  . Hypertension 01/17/2014  . 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 Noncompliance with medication regimen 01/17/2014  . Pure hypercholesterolemia 01/17/2014  . H/O calcium pyrophosphate deposition disease (CPPD) 12/16/2013  . Osteoarthritis 12/16/2013  . Osteoarthrosis 12/16/2013  . Personal history of other diseases of the musculoskeletal system and connective tissue 12/16/2013   PAST MEDICAL HISTORY:  Active Ambulatory Problems    Diagnosis Date Noted  . Urinary incontinence 02/25/2015  . Atrophic vaginitis 02/25/2015  . Anemia 01/17/2014  . Anxiety 02/27/2016  . B12 deficiency 08/21/2015  . CAD (coronary artery disease) 01/17/2014  . GERD (gastroesophageal reflux disease) 01/17/2014  . H/O calcium pyrophosphate deposition disease (CPPD) 12/16/2013  . High risk medication use 01/28/2015  . Hypertension 01/17/2014  . Immunizations incomplete 12/23/2014  . 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 Moderate episode of recurrent major depressive disorder (HCC) 08/19/2015  . Noncompliance with medication regimen 01/17/2014  . Osteoarthritis 12/16/2013  . Osteoarthrosis 12/16/2013  . Personal history of other diseases of the musculoskeletal system and connective tissue 12/16/2013  . Pure hypercholesterolemia 01/17/2014  . Fall 06/13/2020  .  Asthma 06/13/2020  . Depression 06/13/2020  . Eye injury 06/13/2020  . Acute metabolic encephalopathy 06/13/2020  . Iron deficiency anemia 06/13/2020   Resolved Ambulatory Problems    Diagnosis Date Noted  . No Resolved Ambulatory Problems   Past Medical History:  Diagnosis Date  . Acid reflux   . Arthritis   . Incontinence   . Mitral valve disorder   . Nocturia   . Suicide attempt [facility])    SOCIAL HX:  Social History   Tobacco Use  . Smoking status: Never Smoker  . Smokeless tobacco: Never Used  Substance Use Topics  . Alcohol use: No    Alcohol/week: 0.0 standard drinks   FAMILY HX:  Family History  Problem Relation Age of Onset  . Kidney disease Neg Hx   . Bladder Cancer Neg Hx   . Kidney cancer Neg Hx       ALLERGIES: No Known Allergies   PERTINENT MEDICATIONS:  Outpatient Encounter Medications as of 08/11/2020  Medication Sig  . acetaminophen (TYLENOL) 500 MG tablet Take 1 tablet (500 mg total) by mouth every 6 (six) hours as needed for mild pain, moderate pain, fever or headache.  . albuterol (VENTOLIN HFA) 108 (90 Base) MCG/ACT inhaler Inhale 1-2 puffs into the lungs every 6 (six) hours as needed for wheezing or shortness of breath.  Marland Kitchen amLODipine (NORVASC) 10 MG tablet Take 10 mg by mouth daily.  Marland Kitchen aspirin 81 MG tablet  Take 1 tablet (81 mg total) by mouth daily.  Marland Kitchen atorvastatin (LIPITOR) 40 MG tablet Take 40 mg by mouth daily.  . busPIRone (BUSPAR) 10 MG tablet Take 10 mg by mouth 3 (three) times daily.  . cholecalciferol (VITAMIN D3) 25 MCG (1000 UNIT) tablet Take 4,000 Units by mouth daily.  . diclofenac Sodium (VOLTAREN) 1 % GEL Apply 4 g topically 4 (four) times daily.  Marland Kitchen escitalopram (LEXAPRO) 10 MG tablet Take 10 mg by mouth daily.  Marland Kitchen estradiol (ESTRACE) 0.1 MG/GM vaginal cream Place 1 Applicatorful vaginally at bedtime.  . ferrous sulfate (SM IRON) 325 (65 FE) MG tablet Take 325 mg by mouth.  . furosemide (LASIX) 20 MG tablet Take 20 mg by mouth 2 (two)  times daily.  Marland Kitchen latanoprost (XALATAN) 0.005 % ophthalmic solution Place 1 drop into both eyes at bedtime.  Marland Kitchen MAGNESIUM OXIDE PO Take 500 mg by mouth daily.  Marland Kitchen omeprazole (PRILOSEC) 20 MG capsule Take 20 mg by mouth daily.  . polyethylene glycol (MIRALAX) packet Take 17 g by mouth daily.   . polyvinyl alcohol (LIQUIFILM TEARS) 1.4 % ophthalmic solution Place 1 drop into the left eye 4 (four) times daily.  . potassium chloride (K-DUR,KLOR-CON) 10 MEQ tablet Take 10 mEq by mouth 2 (two) times daily.  . predniSONE (DELTASONE) 10 MG tablet Take 5 mg by mouth daily with breakfast.   . senna (SENOKOT) 8.6 MG TABS tablet Take 2 tablets by mouth at bedtime.  . traMADol (ULTRAM) 50 MG tablet Take 50 mg by mouth 2 (two) times daily.  . valsartan-hydrochlorothiazide (DIOVAN-HCT) 320-25 MG per tablet Take 1 tablet by mouth daily.  . vitamin B-12 (CYANOCOBALAMIN) 250 MCG tablet Take 250 mcg by mouth every 14 (fourteen) days.    No facility-administered encounter medications on file as of 08/11/2020.    Objective: ROS    General: NAD ENMT: endorses dysphagia Cardiovascular: denies chest pain Pulmonary: denies  cough, denies increased SOB Abdomen: endorses poor intake and  appetite, endorses occ  constipation, endorses incontinence of bowel GU: denies dysuria, endorses incontinence of urine MSK:  endorses increased ROM limitations esp UE, no falls reported Skin: denies rashes or wounds Neurological: endorses increased weakness, denies pain, denies insomnia Psych: Endorses flat mood Heme/lymph/immuno: denies bruises, abnormal bleeding  Physical Exam: Current and past weights:11/2018= 271 lbs, 11/21= 220, loss of 50 lbs in 18 months, 19% weight loss, BMI =40.2 Constitutional:  NAD General: frail appearing, obese  EYES: anicteric sclera,lids intact, no discharge  ENMT: intact hearing,oral mucous membranes dry CV: S1S2, murmur, RRR, no LE edema Pulmonary: LCTA, no increased work of breathing, no  cough, no audible wheezes, oxygen 2 l Abdomen: intake <25%, normo-active BS +  4 quadrants, soft and non tender, no ascites GU: deferred MSK: mild sarcopenia, decreased ROM in all extremities, UE immobile, non ambulatory Skin: warm and dry, no rashes or wounds on visible skin Neuro: increased generalized weakness, moderate cognitive impairment Psych: non-anxious affect, A and O x 1 Hem/lymph/immuno: no widespread bruising   Thank you for the opportunity to participate in the care of Ms. Prevette.  The palliative care team will continue to follow. Please call our office at [phone] if we can be of additional assistance.  Jason Coop, NP , DNP, MPH, AGPCNP-BC, ACHPN  COVID-19 PATIENT SCREENING TOOL  Person answering questions: ____________staff______ _____   1.  Is the patient or any family member in the home showing any signs or symptoms regarding respiratory infection?  Person with Symptom- __________NA_________________  a. Fever                                                                          Yes___ No___          ___________________  b. Shortness of breath                                                    Yes___ No___          ___________________ c. Cough/congestion                                       Yes___  No___         ___________________ d. Body aches/pains                                                         Yes___ No___        ____________________ e. Gastrointestinal symptoms (diarrhea, nausea)           Yes___ No___        ____________________  2. Within the past 14 days, has anyone living in the home had any contact with someone with or under investigation for COVID-19?    Yes___ No_X_   Person __________________   

## 2020-08-13 ENCOUNTER — Non-Acute Institutional Stay: Payer: Self-pay | Admitting: Primary Care

## 2020-08-25 ENCOUNTER — Other Ambulatory Visit: Payer: Self-pay

## 2020-08-25 ENCOUNTER — Non-Acute Institutional Stay: Payer: Medicare Other | Admitting: Primary Care

## 2020-08-25 DIAGNOSIS — F331 Major depressive disorder, recurrent, moderate: Secondary | ICD-10-CM

## 2020-08-25 DIAGNOSIS — M159 Polyosteoarthritis, unspecified: Secondary | ICD-10-CM

## 2020-08-25 DIAGNOSIS — Z515 Encounter for palliative care: Secondary | ICD-10-CM

## 2020-08-25 DIAGNOSIS — I2510 Atherosclerotic heart disease of native coronary artery without angina pectoris: Secondary | ICD-10-CM

## 2020-08-25 NOTE — Progress Notes (Signed)
Therapist, nutritional Palliative Care Consult Note Telephone: [phone]  Fax: [phone]   TELEHEALTH VISIT STATEMENT Due to the COVID-19 crisis, this visit was done via telemedicine from my office. It was initiated and consented to by this patient and/or family.   Date of encounter: 08/25/20 PATIENT NAME: Annette Combs [address] [place] [address] [phone] (home)  DOB: 08/31/1928 MRN: [id number]  PRIMARY CARE PROVIDER:    Clovis Cao, MD,  [address] [place] [address] [phone]  REFERRING PROVIDER:   Clovis Cao, MD [address] [place],  [place] [address] [phone]  RESPONSIBLE PARTY:   Extended Emergency Contact Information Primary Emergency Contact: Mariana Single A Address: [address]          [place], [place] [address] [place] Home Phone: [phone] Mobile Phone: [phone] Relation: Daughter Secondary Emergency Contact: Shizuye, Rupert Mobile Phone: [phone] Relation: Son Interpreter needed? No  Palliative Care was asked to follow this patient by consultation request of Clovis Cao, MD to help address advance care planning and goals of care. This is a follow up  visit.   ASSESSMENT AND RECOMMENDATIONS:   1. Advance Care Planning/Goals of Care: Goals include to maximize quality of life and symptom management. Our advance care planning conversation included a discussion about:     Exploration of personal, cultural or spiritual beliefs that might influence medical decisions   Exploration of goals of care in the event of a sudden injury or illness   Identification and preparation of a healthcare agent - Daughter and son  Review of an  advance directive document: DNR on file, reviewed  Decision to de-escalate disease focused treatments due to poor prognosis.Decision made by son to enroll pt in hospice. Discussed patient poor prognosis. She was already in severe decline when she  became covid infected.  2. Symptom Management:   Patient appears comfortable and calm. Oxygen is applied. Staff states no s/sx pain or discomfort. Employing comfort measures.  3. Follow up Palliative Care Visit: Admit to hospice services in place.  4. Family /Caregiver/Community Supports: Son and daughter are Research officer, political party. Lives in LTC.  5. Cognitive / Functional decline:  Lethargic, min responsive. Dependent in all adls.  I spent 35 minutes providing this consultation,  from 1600 to 1635. More than 50% of the time in this consultation was spent in counseling and care coordination.  CODE STATUS: DNR  PPS: 20% (weak)  HOSPICE ELIGIBILITY/DIAGNOSIS: TBD  Subjective:  CHIEF COMPLAINT: EOL care  HISTORY OF PRESENT ILLNESS:  Annette Combs is a 87 y.o. year old female  with h/o MDD, agoraphobia, obesity, OA, GERD, recurrent UTI, weight loss. She has declined in past 4-8 weeks from UTI, dysphagia and increased confusion. Covid infection 24-48 hrs ago. Now at eol .   We are asked to consult around advance care planning and complex medical decision making.    Review and summarization of old Epic records shows or history from other than patient. Review of case with family member Zollie Beckers History obtained from review of EMR, discussion with primary team, and  interview with family, caregiver  and/or Ms. Sui. Records reviewed and summarized above.    CURRENT PROBLEM LIST:  Patient Active Problem List   Diagnosis Date Noted  . Fall 06/13/2020  . Asthma 06/13/2020  . Depression 06/13/2020  . Eye injury 06/13/2020  . Acute metabolic encephalopathy 06/13/2020  . Iron deficiency anemia 06/13/2020  . Anxiety 02/27/2016  . B12 deficiency  08/21/2015  . Moderate episode of recurrent major depressive disorder (HCC) 08/19/2015  . Urinary incontinence 02/25/2015  . Atrophic vaginitis 02/25/2015  . High risk medication use 01/28/2015  . Immunizations incomplete 12/23/2014  . Anemia  01/17/2014  . CAD (coronary artery disease) 01/17/2014  . GERD (gastroesophageal reflux disease) 01/17/2014  . Hypertension 01/17/2014  . 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 Noncompliance with medication regimen 01/17/2014  . Pure hypercholesterolemia 01/17/2014  . H/O calcium pyrophosphate deposition disease (CPPD) 12/16/2013  . Osteoarthritis 12/16/2013  . Osteoarthrosis 12/16/2013  . Personal history of other diseases of the musculoskeletal system and connective tissue 12/16/2013   PAST MEDICAL HISTORY:  Active Ambulatory Problems    Diagnosis Date Noted  . Urinary incontinence 02/25/2015  . Atrophic vaginitis 02/25/2015  . Anemia 01/17/2014  . Anxiety 02/27/2016  . B12 deficiency 08/21/2015  . CAD (coronary artery disease) 01/17/2014  . GERD (gastroesophageal reflux disease) 01/17/2014  . H/O calcium pyrophosphate deposition disease (CPPD) 12/16/2013  . High risk medication use 01/28/2015  . Hypertension 01/17/2014  . Immunizations incomplete 12/23/2014  . 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 Moderate episode of recurrent major depressive disorder (HCC) 08/19/2015  . Noncompliance with medication regimen 01/17/2014  . Osteoarthritis 12/16/2013  . Osteoarthrosis 12/16/2013  . Personal history of other diseases of the musculoskeletal system and connective tissue 12/16/2013  . Pure hypercholesterolemia 01/17/2014  . Fall 06/13/2020  . Asthma 06/13/2020  . Depression 06/13/2020  . Eye injury 06/13/2020  . Acute metabolic encephalopathy 06/13/2020  . Iron deficiency anemia 06/13/2020   Resolved Ambulatory Problems    Diagnosis Date Noted  . No Resolved Ambulatory Problems   Past Medical History:  Diagnosis Date  . Acid reflux   . Arthritis   . Incontinence   . Mitral valve disorder   . Nocturia   . Suicide attempt [facility])    SOCIAL HX:  Social History   Tobacco Use  . Smoking status: Never Smoker  . Smokeless tobacco: Never Used  Substance Use Topics  . Alcohol use: No     Alcohol/week: 0.0 standard drinks   FAMILY HX:  Family History  Problem Relation Age of Onset  . Kidney disease Neg Hx   . Bladder Cancer Neg Hx   . Kidney cancer Neg Hx        ALLERGIES: No Known Allergies   PERTINENT MEDICATIONS:  Outpatient Encounter Medications as of 08/25/2020  Medication Sig  . acetaminophen (TYLENOL) 500 MG tablet Take 1 tablet (500 mg total) by mouth every 6 (six) hours as needed for mild pain, moderate pain, fever or headache.  . albuterol (VENTOLIN HFA) 108 (90 Base) MCG/ACT inhaler Inhale 1-2 puffs into the lungs every 6 (six) hours as needed for wheezing or shortness of breath.  Marland Kitchen amLODipine (NORVASC) 10 MG tablet Take 10 mg by mouth daily.  Marland Kitchen aspirin 81 MG tablet Take 1 tablet (81 mg total) by mouth daily.  Marland Kitchen atorvastatin (LIPITOR) 40 MG tablet Take 40 mg by mouth daily.  . busPIRone (BUSPAR) 10 MG tablet Take 10 mg by mouth 3 (three) times daily.  . cholecalciferol (VITAMIN D3) 25 MCG (1000 UNIT) tablet Take 4,000 Units by mouth daily.  . diclofenac Sodium (VOLTAREN) 1 % GEL Apply 4 g topically 4 (four) times daily.  Marland Kitchen escitalopram (LEXAPRO) 10 MG tablet Take 10 mg by mouth daily.  Marland Kitchen estradiol (ESTRACE) 0.1 MG/GM vaginal cream Place 1 Applicatorful vaginally at bedtime.  . ferrous sulfate (SM IRON) 325 (65 FE) MG tablet Take  325 mg by mouth.  . furosemide (LASIX) 20 MG tablet Take 20 mg by mouth 2 (two) times daily.  Marland Kitchen latanoprost (XALATAN) 0.005 % ophthalmic solution Place 1 drop into both eyes at bedtime.  Marland Kitchen MAGNESIUM OXIDE PO Take 500 mg by mouth daily.  Marland Kitchen omeprazole (PRILOSEC) 20 MG capsule Take 20 mg by mouth daily.  . polyethylene glycol (MIRALAX) packet Take 17 g by mouth daily.   . polyvinyl alcohol (LIQUIFILM TEARS) 1.4 % ophthalmic solution Place 1 drop into the left eye 4 (four) times daily.  . potassium chloride (K-DUR,KLOR-CON) 10 MEQ tablet Take 10 mEq by mouth 2 (two) times daily.  . predniSONE (DELTASONE) 10 MG tablet Take 5 mg by mouth  daily with breakfast.   . senna (SENOKOT) 8.6 MG TABS tablet Take 2 tablets by mouth at bedtime.  . traMADol (ULTRAM) 50 MG tablet Take 50 mg by mouth 2 (two) times daily.  . valsartan-hydrochlorothiazide (DIOVAN-HCT) 320-25 MG per tablet Take 1 tablet by mouth daily.  . vitamin B-12 (CYANOCOBALAMIN) 250 MCG tablet Take 250 mcg by mouth every 14 (fourteen) days.    No facility-administered encounter medications on file as of 08/25/2020.    Objective: ROS/staff   General: NAD ENMT: endorses dysphagia, NPO Pulmonary: denies  cough, denies increased SOB GU: denies dysuria, endorses continence of urine MSK:  endorses ROM limitations, no falls reported Skin: denies rashes or wounds Neurological: endorses ++weakness, denies pain, denies insomnia Psych: lethargy Heme/lymph/immuno: denies bruises, abnormal bleeding  Physical Exam: Current and past weights:  220 lbs Constitutional:  NAD General: frail appearing, obese  EYES: lids intact, no discharge  ENMT: intact hearing,oral mucous membranes moist, edentulous Pulmonary: no increased work of breathing, no cough, no audible wheezes, supplemental oxygen  Abdomen: npo , no ascites GU: deferred MSK: mild sarcopenia, decreased ROM in all extremities, no contractures of LE, non ambulatory Skin: warm and dry, no rashes or wounds on visible skin Neuro: Increased gneralized weakness, severe cognitive impairment Psych: lethargy Hem/lymph/immuno: no widespread bruising   Thank you for the opportunity to participate in the care of Ms. Gisler.  The palliative care team will continue to follow. Please call our office at [phone] if we can be of additional assistance.  Eliezer Lofts, NP , DNP, MPH, AGPCNP-BC, [facility]

## 2020-09-01 ENCOUNTER — Telehealth: Payer: Self-pay | Admitting: Primary Care

## 2020-09-01 ENCOUNTER — Encounter: Payer: Self-pay | Admitting: Primary Care

## 2020-09-01 NOTE — Telephone Encounter (Signed)
Notified of patient death on 2020/09/04 prior to hospice admission.

## 2020-09-21 DEATH — deceased

## 2021-12-06 IMAGING — DX DG CHEST 1V PORT
1 series · 1 of 1 positions shown · non-contrast
Comparison: 12/18/2018 chest radiograph

CLINICAL DATA: Fall and pain.

EXAM:
PORTABLE CHEST 1 VIEW

[chest ap]
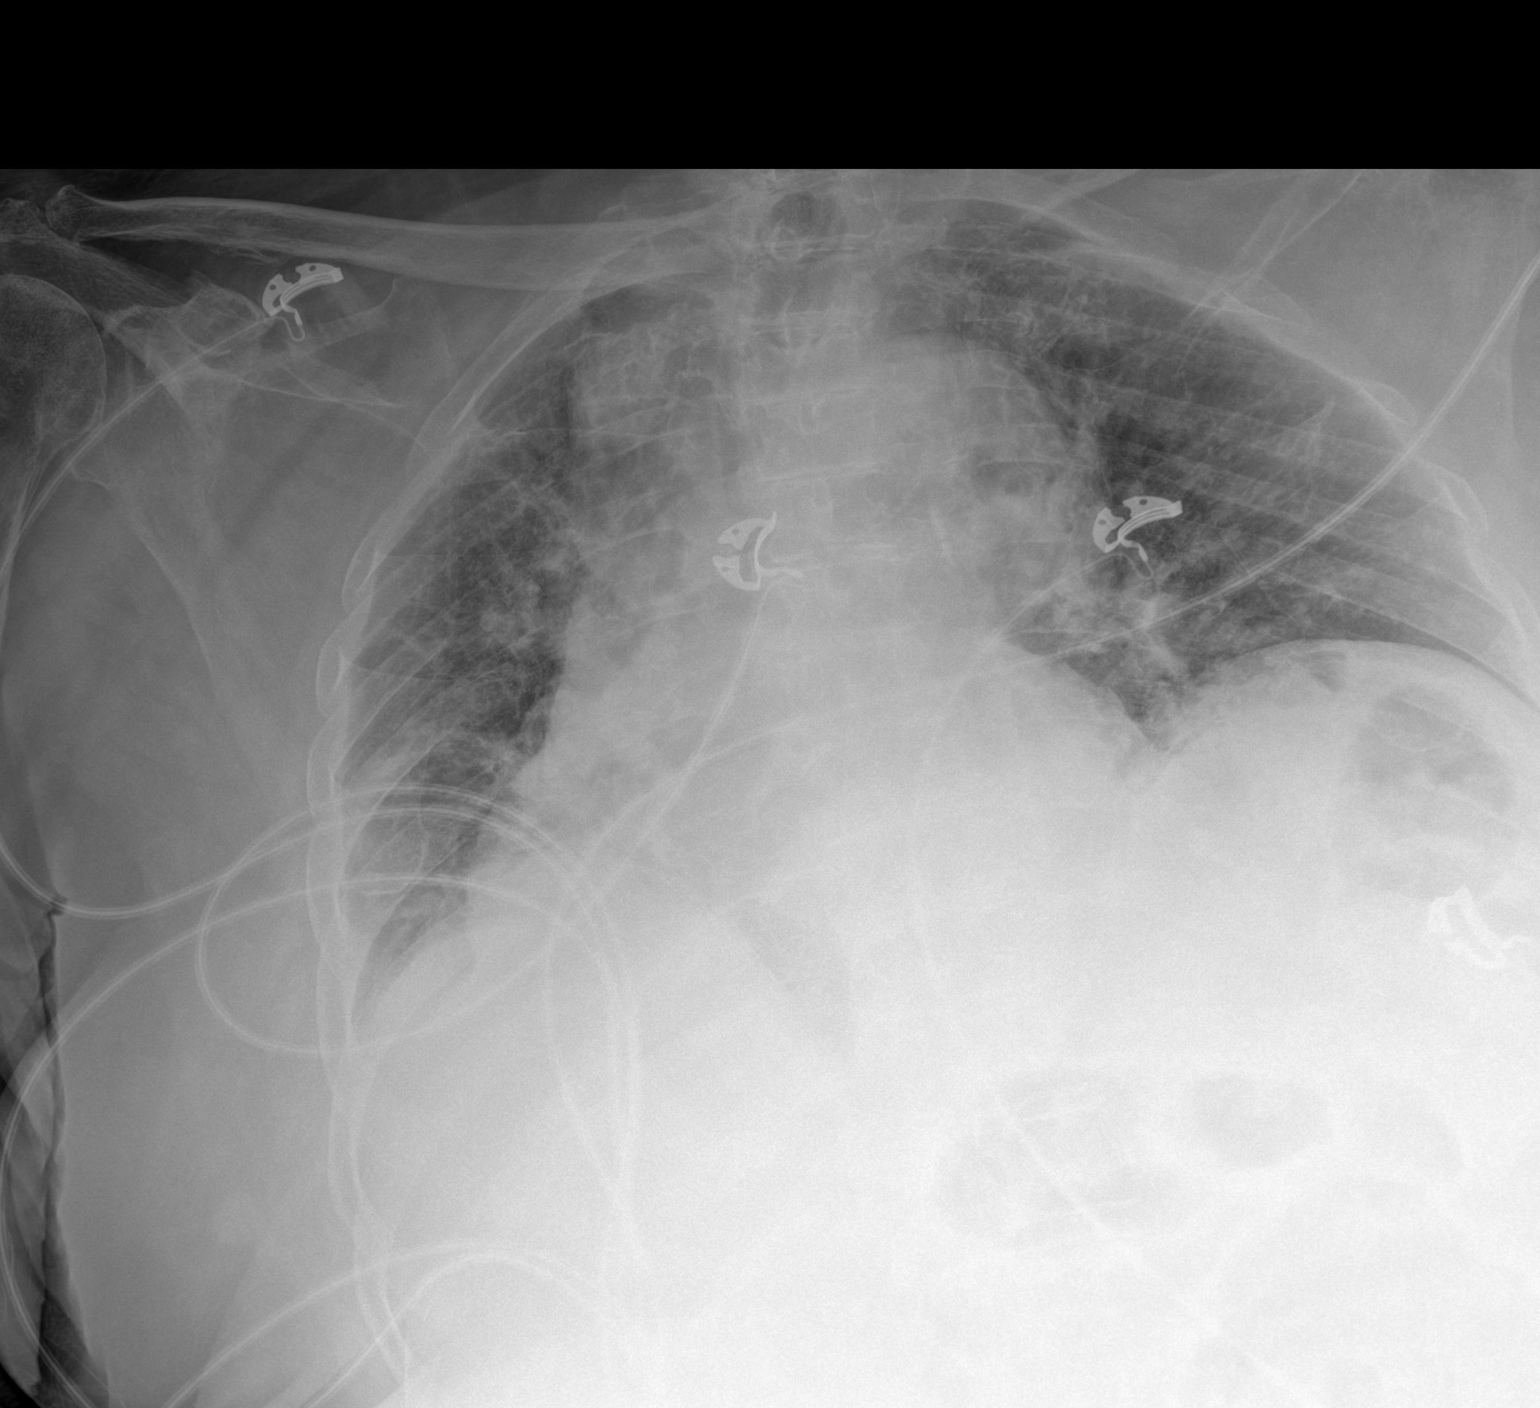

[1 of 1 positions shown; findings below may reference images not displayed]

FINDINGS: This is a low volume and rotated study due to patient condition.

Cardiomegaly with bilateral interstitial and hazy opacities are
noted, but not significantly changed.

Bibasilar atelectasis/scarring again identified.

No pneumothorax or large pleural effusion noted.

No acute bony abnormalities are identified.
IMPRESSION: Unchanged appearance of the chest.  No acute abnormalities noted.

Cardiomegaly with chronic appearing bilateral interstitial and hazy
opacities.

## 2021-12-06 IMAGING — CT CT MAXILLOFACIAL W/O CM
3 series · 15 of 47 positions shown, 18 images · non-contrast
Comparison: None.

CLINICAL DATA: 89-year-old female with head, face and neck injury
following fall. Initial encounter.

EXAM:
CT HEAD WITHOUT CONTRAST
CT MAXILLOFACIAL WITHOUT CONTRAST
CT CERVICAL SPINE WITHOUT CONTRAST
TECHNIQUE: Multidetector CT imaging of the head, cervical spine, and
maxillofacial structures were performed using the standard protocol
without intravenous contrast. Multiplanar CT image reconstructions
of the cervical spine and maxillofacial structures were also
generated.

[Series 3: max soft · axial · 0.40mm/px · z∈[-214,-56]mm · 9 of 93 slices shown, 12 images]
[im 7/93  brain]
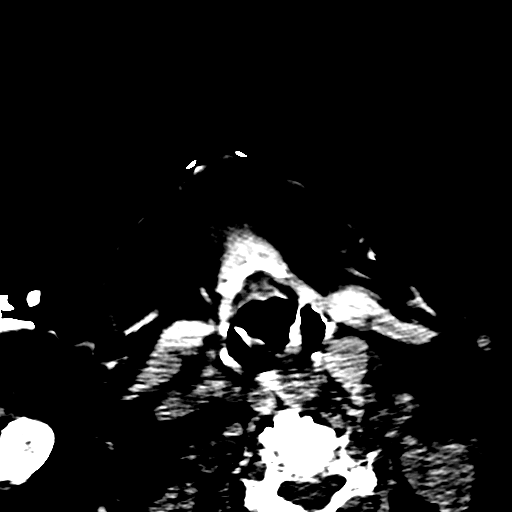
[im 7/93  bone]
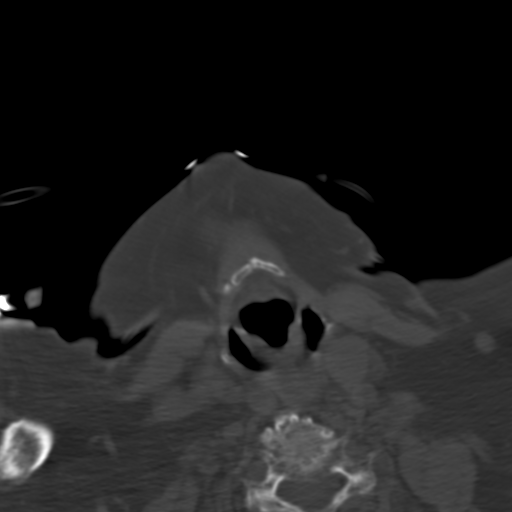
[im 16/93  bone]
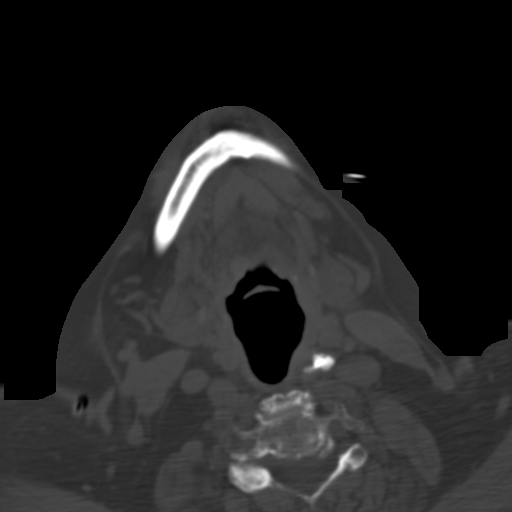
[im 26/93  bone]
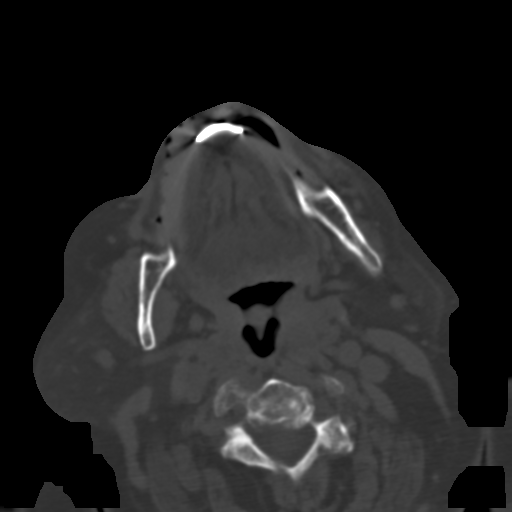
[im 35/93  bone]
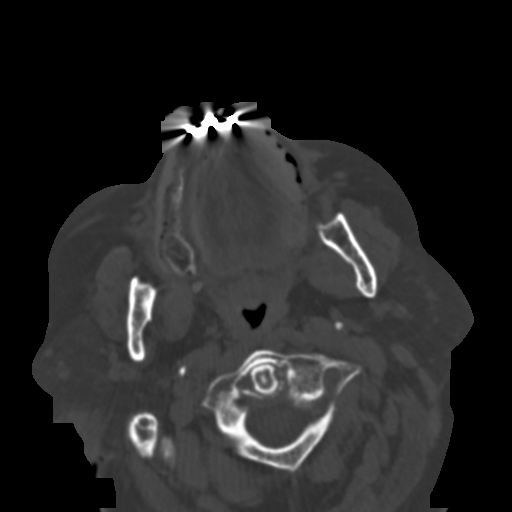
[im 48/93  brain]
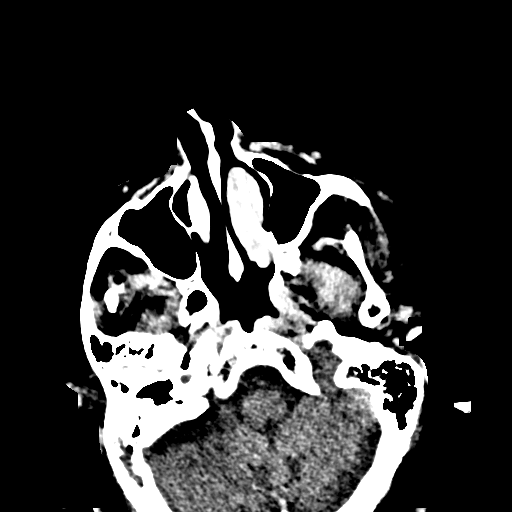
[im 48/93  bone]
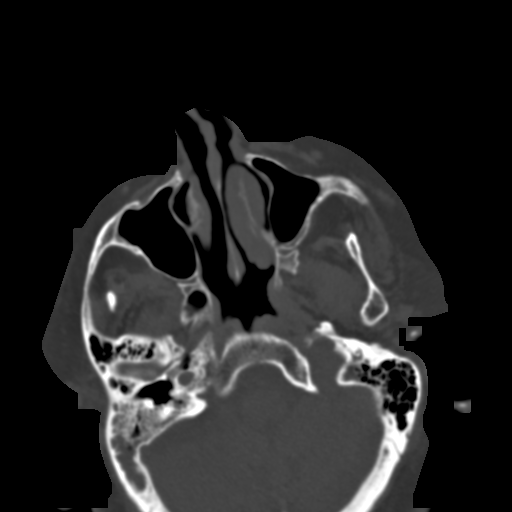
[im 58/93  bone]
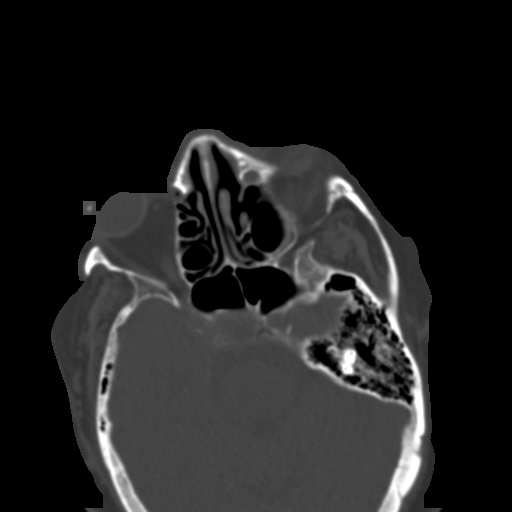
[im 67/93  bone]
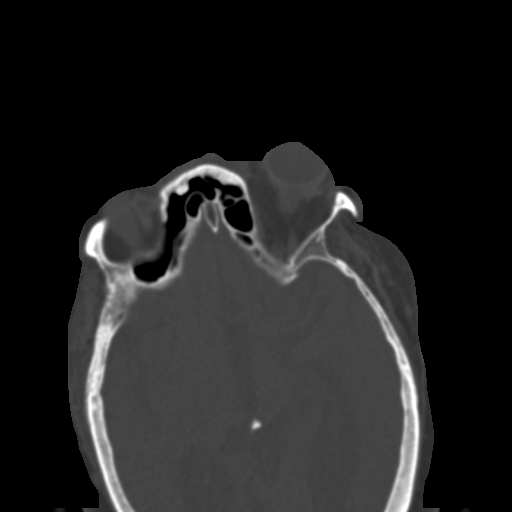
[im 77/93  bone]
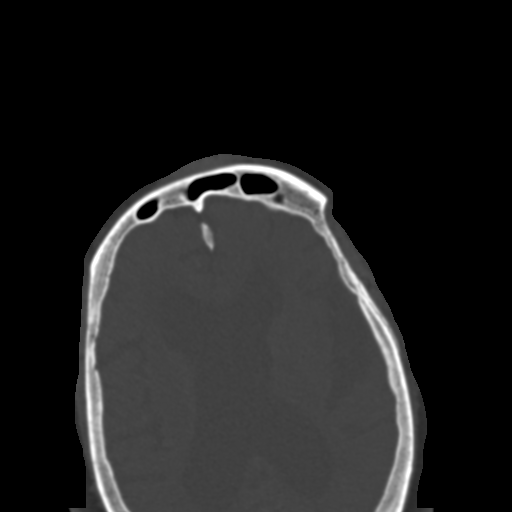
[im 86/93  brain]
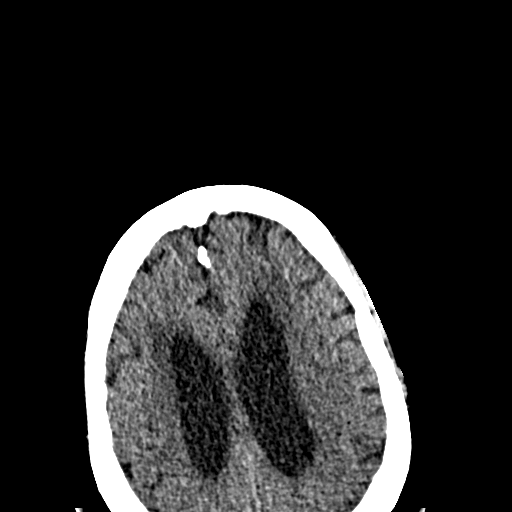
[im 86/93  bone]
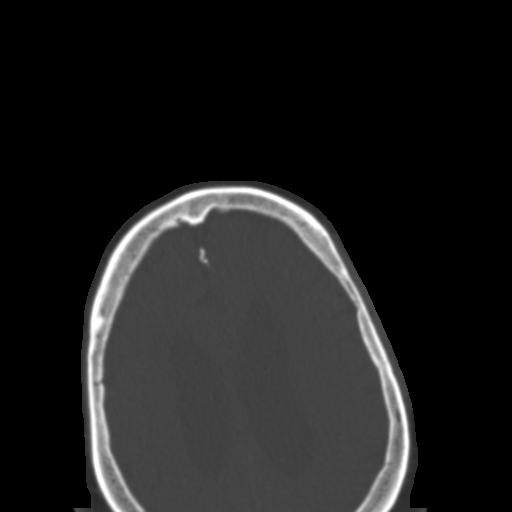

[Series 6: coronal soft · coronal · 0.36mm/px · 3 of 89 slices shown]
[im 30/89  bone]
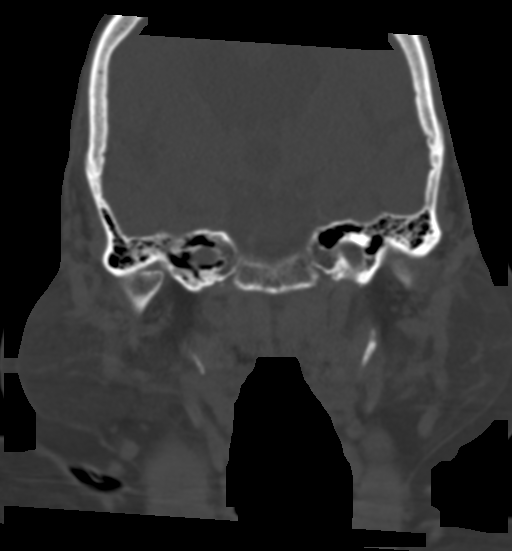
[im 40/89  bone]
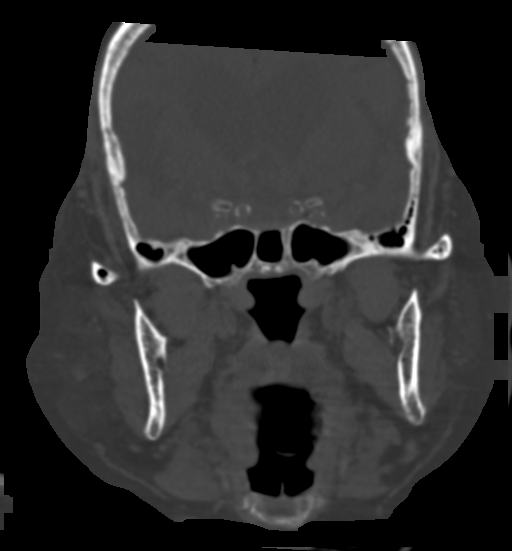
[im 49/89  bone]
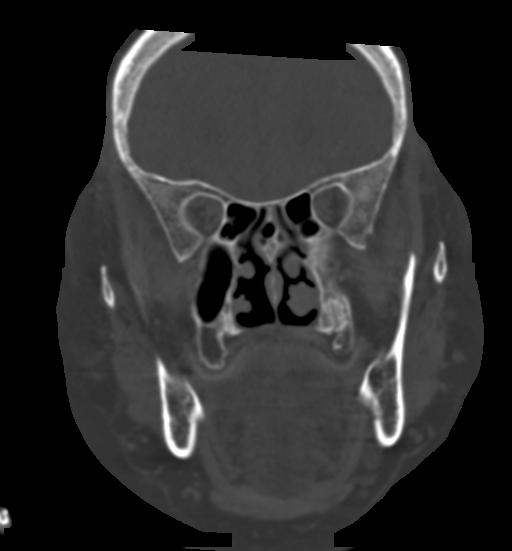

[Series 7: sagittal soft · sagittal · 0.35mm/px · 3 of 94 slices shown]
[im 32/94  bone]
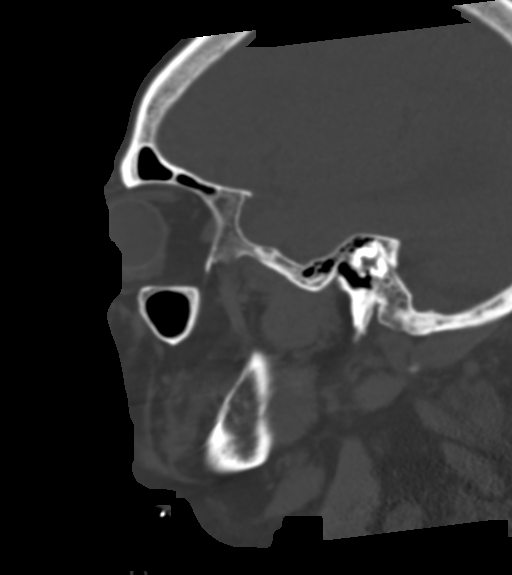
[im 47/94  bone]
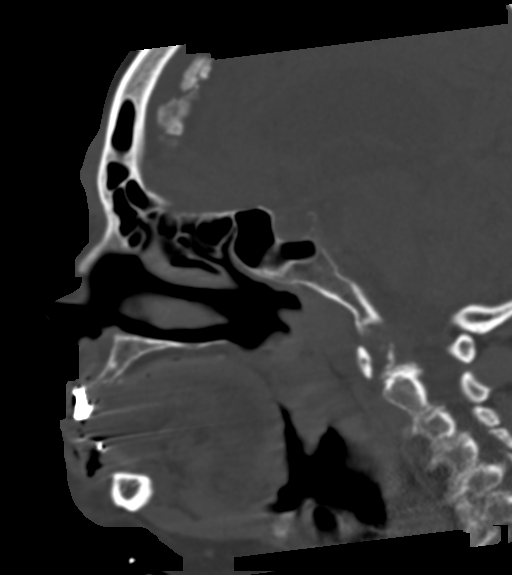
[im 63/94  bone]
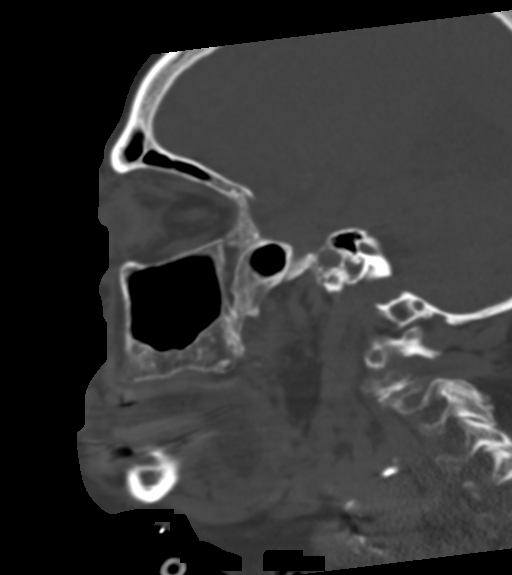

[15 of 47 positions shown; findings below may reference images not displayed]

FINDINGS: CT HEAD FINDINGS

Brain: No evidence of acute infarction, hemorrhage, hydrocephalus,
extra-axial collection or mass lesion/mass effect.

Moderate periventricular white matter hypodensities likely
represents chronic small-vessel white matter ischemic changes.

Vascular: Carotid atherosclerotic calcifications are noted.

Skull: No acute fracture.

Other: Mild forehead soft tissue swelling noted.

CT MAXILLOFACIAL FINDINGS

Osseous: No acute fracture is identified. No subluxation or
dislocation noted.

Orbits: There is hemorrhage along the posterior aspect of the LEFT
globe, at the junction of the optic nerve and globe. The globe
retains its spherical shape and there is no gross intraglobal
abnormality. LEFT proptosis and LEFT preseptal soft tissue swelling
is also noted.

The RIGHT orbit is unremarkable.

There is no evidence of acute orbital fracture.

Sinuses: A RIGHT mastoid effusion is noted without identifiable
fracture. The middle and inner ears are clear bilaterally. The
paranasal sinuses are clear.

Soft tissues: Forehead soft tissue swelling is noted.

CT CERVICAL SPINE FINDINGS

Alignment: Loss of the normal cervical lordosis is noted without
subluxation.

Skull base and vertebrae: No acute fracture. No primary bone lesion
or focal pathologic process.

Soft tissues and spinal canal: No prevertebral fluid or swelling. No
visible canal hematoma.

Disc levels: Moderate multilevel degenerative disc
disease/spondylosis and facet arthropathy noted.

Upper chest: No acute abnormality

Other: None
IMPRESSION: 1. Hemorrhage/injury at the junction of the LEFT optic nerve and
LEFT globe. LEFT proptosis without evidence of gross intraglobal
abnormality. LEFT preseptal soft tissue swelling without orbital
fracture.
2. No evidence of acute intracranial abnormality. Moderate chronic
small-vessel white matter ischemic changes.
3. No static evidence of acute injury to the cervical spine.
Moderate multilevel degenerative disc disease/spondylosis and facet
arthropathy.
4. RIGHT mastoid effusion without identifiable mastoid fracture.
Middle and inner ears are clear.
